# Patient Record
Sex: Female | Born: 1945 | Race: White | Hispanic: No | Marital: Married | Smoking: Never smoker
Health system: Southern US, Community
[De-identification: ages and names within clinical notes are randomized; demographics above are authoritative.]

## PROBLEM LIST (undated history)

## (undated) DIAGNOSIS — A389 Scarlet fever, uncomplicated: Secondary | ICD-10-CM

## (undated) HISTORY — PX: HAND SURGERY: SHX662

---
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
## 1976-07-28 HISTORY — PX: DIAGNOSTIC LAPAROSCOPY: SUR761

## 1997-10-03 ENCOUNTER — Ambulatory Visit (HOSPITAL_COMMUNITY): Admission: RE | Admit: 1997-10-03 | Discharge: 1997-10-03 | Payer: Self-pay | Admitting: Obstetrics & Gynecology

## 1997-10-05 ENCOUNTER — Ambulatory Visit (HOSPITAL_COMMUNITY): Admission: RE | Admit: 1997-10-05 | Discharge: 1997-10-05 | Payer: Self-pay | Admitting: Obstetrics & Gynecology

## 1997-10-12 ENCOUNTER — Other Ambulatory Visit: Admission: RE | Admit: 1997-10-12 | Discharge: 1997-10-12 | Payer: Self-pay | Admitting: Obstetrics & Gynecology

## 1998-06-14 ENCOUNTER — Ambulatory Visit (HOSPITAL_COMMUNITY): Admission: RE | Admit: 1998-06-14 | Discharge: 1998-06-14 | Payer: Self-pay | Admitting: Obstetrics & Gynecology

## 1998-06-14 ENCOUNTER — Encounter: Payer: Self-pay | Admitting: Obstetrics & Gynecology

## 1998-10-18 ENCOUNTER — Other Ambulatory Visit: Admission: RE | Admit: 1998-10-18 | Discharge: 1998-10-18 | Payer: Self-pay | Admitting: Obstetrics & Gynecology

## 1999-03-25 ENCOUNTER — Ambulatory Visit (HOSPITAL_COMMUNITY): Admission: RE | Admit: 1999-03-25 | Discharge: 1999-03-25 | Payer: Self-pay | Admitting: Obstetrics & Gynecology

## 1999-03-25 ENCOUNTER — Encounter: Payer: Self-pay | Admitting: Obstetrics & Gynecology

## 2000-06-11 ENCOUNTER — Encounter: Payer: Self-pay | Admitting: Obstetrics & Gynecology

## 2000-06-11 ENCOUNTER — Ambulatory Visit (HOSPITAL_COMMUNITY): Admission: RE | Admit: 2000-06-11 | Discharge: 2000-06-11 | Payer: Self-pay | Admitting: Obstetrics & Gynecology

## 2000-08-10 ENCOUNTER — Other Ambulatory Visit: Admission: RE | Admit: 2000-08-10 | Discharge: 2000-08-10 | Payer: Self-pay | Admitting: Obstetrics & Gynecology

## 2001-02-02 ENCOUNTER — Encounter (INDEPENDENT_AMBULATORY_CARE_PROVIDER_SITE_OTHER): Payer: Self-pay | Admitting: Specialist

## 2001-02-02 ENCOUNTER — Inpatient Hospital Stay (HOSPITAL_COMMUNITY): Admission: RE | Admit: 2001-02-02 | Discharge: 2001-02-05 | Payer: Self-pay | Admitting: Obstetrics & Gynecology

## 2001-08-03 ENCOUNTER — Ambulatory Visit (HOSPITAL_COMMUNITY): Admission: RE | Admit: 2001-08-03 | Discharge: 2001-08-03 | Payer: Self-pay | Admitting: Obstetrics & Gynecology

## 2001-08-03 ENCOUNTER — Encounter: Payer: Self-pay | Admitting: Obstetrics & Gynecology

## 2002-11-07 ENCOUNTER — Encounter: Payer: Self-pay | Admitting: Obstetrics & Gynecology

## 2002-11-07 ENCOUNTER — Ambulatory Visit (HOSPITAL_COMMUNITY): Admission: RE | Admit: 2002-11-07 | Discharge: 2002-11-07 | Payer: Self-pay | Admitting: Obstetrics & Gynecology

## 2002-11-09 ENCOUNTER — Encounter: Payer: Self-pay | Admitting: Obstetrics & Gynecology

## 2002-11-09 ENCOUNTER — Encounter: Admission: RE | Admit: 2002-11-09 | Discharge: 2002-11-09 | Payer: Self-pay | Admitting: Obstetrics & Gynecology

## 2002-11-16 ENCOUNTER — Other Ambulatory Visit: Admission: RE | Admit: 2002-11-16 | Discharge: 2002-11-16 | Payer: Self-pay | Admitting: Obstetrics & Gynecology

## 2004-01-25 ENCOUNTER — Other Ambulatory Visit: Admission: RE | Admit: 2004-01-25 | Discharge: 2004-01-25 | Payer: Self-pay | Admitting: Obstetrics & Gynecology

## 2004-06-24 ENCOUNTER — Ambulatory Visit: Payer: Self-pay | Admitting: Internal Medicine

## 2004-07-22 ENCOUNTER — Ambulatory Visit (HOSPITAL_COMMUNITY): Admission: RE | Admit: 2004-07-22 | Discharge: 2004-07-22 | Payer: Self-pay | Admitting: Internal Medicine

## 2004-07-23 ENCOUNTER — Ambulatory Visit: Payer: Self-pay | Admitting: Internal Medicine

## 2004-08-27 ENCOUNTER — Ambulatory Visit: Payer: Self-pay | Admitting: Internal Medicine

## 2004-10-11 ENCOUNTER — Ambulatory Visit: Payer: Self-pay | Admitting: Internal Medicine

## 2005-06-12 ENCOUNTER — Other Ambulatory Visit: Admission: RE | Admit: 2005-06-12 | Discharge: 2005-06-12 | Payer: Self-pay | Admitting: Obstetrics & Gynecology

## 2008-08-29 ENCOUNTER — Ambulatory Visit: Payer: Self-pay | Admitting: Internal Medicine

## 2008-09-12 ENCOUNTER — Ambulatory Visit: Payer: Self-pay | Admitting: Internal Medicine

## 2010-12-13 NOTE — H&P (Signed)
[facility]  Patient:    Annette Wilson, Annette Wilson                  MRN: [id number] Adm. Date:  02/02/01 Attending:  Freddy Finner, M.D.                         History and Physical  SOCIAL SECURITY NUMBER:       [id number]  DATE OF BIRTH:                Jul 07, 1946  ADMITTING DIAGNOSES:          1. Cystocele with prolapse.                               2. Uterine prolapse.                               3. First degree rectocele.                               4. Symptomatic pelvic relaxation.  HISTORY:                      The patient is a 65 year old married white female, gravida 3, para 2 who has a long history of cystocele.  Over the past couple of years, she has had progressive increase in symptoms and prolapse, and now notices complete prolapse of the cystocele with Valsalva.  On physical examination in the office, this was confirmed.  She does not have stress incontinence at this point, but does have a lot of pressure, which precludes normal physical activity.  Also on exam, she was found to have uterine prolapse and a first degree rectocele.  The uterus itself was slightly enlarged to approximately eight-week size.  There were no palpable adnexal masses.  The patient is now admitted for definitive surgery.  Specifically total vaginal hysterectomy and bilateral salpingo-oophorectomy, anterior and posterior vaginal repair and possible sacrospinous ligament suspension.  REVIEW OF SYSTEMS:            Otherwise negative.  No cardiopulmonary, GI or GU complaints.  PAST MEDICAL HISTORY:         No significant medical illnesses.  Recent normal mammogram and Pap smear within the last eight months.  CURRENT MEDICATIONS:          None.  ALLERGIES TO MEDICATIONS:     Aspirin.  Duricef.  Cephalosporin.  BLOOD TRANSFUSIONS:           None.  CIGARETTES:                   None.  PAST SURGICAL HISTORY:        Vaginal delivery x 2.   Laparoscopy.  FAMILY HISTORY:               Noncontributory.  PHYSICAL EXAMINATION:  HEENT:                        Grossly within normal limits.  VITAL SIGNS:                  Blood pressure in the office 120/70, weight 140 lb.  NECK:  Thyroid gland is not palpably enlarged.  BREASTS:                      Normal.  No skin change.  No nipple discharge. No palpable masses.  HEART:                        Normal sinus rhythm without murmurs, rubs, or gallops.  ABDOMEN:                      Soft and nontender without appreciable organomegaly or palpable masses.  PELVIC:                       Findings are noted above.  EXTREMITIES:                  Without clubbing, cyanosis, or edema.  ASSESSMENT:                   Symptomatic pelvic relaxation with third degree cystocele, uterine prolapse and first degree rectocele.  PLAN:                         Surgery as outline above.  The patient has been counseled extensively regarding her surgery including a video which she reviewed in the office describing the operative procedure.  She is aware of the potential risks of the procedure and the small potential risk of long-term failure of the procedure. DD:  02/01/01 TD:  02/02/01 Job: [id number]

## 2010-12-13 NOTE — Discharge Summary (Signed)
[facility]  Patient:    Annette Wilson, Annette Wilson                  MRN: [id number] Adm. Date:  [id number] Disc. Date: [id number] Attending:  Minette Headland                           Discharge Summary  DISCHARGE DIAGNOSES:          1. Uterine prolapse.                               2. Cystocele with prolapse.                               3. Rectocele.                               4. 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  OPERATIVE PROCEDURE:          1. Total vaginal hysterectomy.                               2. Bilateral salpingo-oophorectomy.                               3. Anterior and posterior vaginal repair.                               4. Sacrospinous ligament suspension.  POSTOPERATIVE COMPLICATIONS:  None.  DISPOSITION:                  The patient was in satisfactory and improved condition at the time of her discharge on the third postoperative day. She was ambulating without difficulty. She was having adequate bowel and bladder function. Her condition was considered to be good.  DISCHARGE MEDICATIONS:        She was discharged home with Percocet for pain. In addition to her pain medication, she was given Urispas 100 mg to be taken t.i.d. as needed.  DISCHARGE INSTRUCTIONS:       Regular diet. She is to report fever, heavy vaginal bleeding. She is to avoid heavy lifting or vaginal entry.  DISCHARGE FOLLOWUP:           The patient is to follow up in the office in approximately two weeks.  Details of the present illness, past history, family history, review of systems, and physical exam recorded in the admission note. Physical findings on admission were essentially normal except for the pelvic findings as noted in the discharge diagnosis.  LABORATORY DATA:              Initial hemoglobin 14.6,  postoperative hemoglobin of 10.4 on the first postoperative  day and 10 on the second postoperative day. Prothrombin time, PTT, and INR were all normal on admission.  HOSPITAL COURSE:              The patient was admitted on the morning of surgery, July 9. She was treated perioperatively with PAS hose. She was given intravenous antibiotic preoperatively. The above described procedure was accomplished without significant intraoperative difficulty. Her postoperative course was uncomplicated. Her condition was good on the third postoperative day and she was discharged home with disposition as noted above. DD:  03/03/01 TD:  03/03/01 Job: [id number]

## 2010-12-13 NOTE — Op Note (Signed)
[facility]  Patient:    Annette Wilson, Annette Wilson                  MRN: [id number] Proc. Date: 02/02/01 Adm. Date:  [id number] Attending:  Minette Headland                           Operative Report  PREOPERATIVE DIAGNOSES:       Cystocele with prolapse, uterine prolapse, first degree rectocele.  POSTOPERATIVE DIAGNOSES:  OPERATIVE PROCEDURE:          Total vaginal hysterectomy, bilateral salpingo-oophorectomy, anterior and posterior colporrhaphy, sacrospinous ligament suspension, suprapubic cystotomy with Bonnano catheter.  SURGEON:                      Freddy Finner, M.D.  ASSISTANTWilley Blade, M.D.  ANESTHESIA:  ESTIMATED INTRAOPERATIVE BLOOD LOSS:  350 cc.  ANESTHESIA:                   General endotracheal.  INTRAOPERATIVE COMPLICATIONS:  None.  INDICATIONS:                  Details of the present illness are recorded in the admission note.  Patient was admitted on the morning of surgery.  Due to antibiotic allergies, it was elected to give her Zithromax 500 mg IV preoperatively.  DESCRIPTION OF PROCEDURE:     She was taken to the operating room and there placed under adequate general endotracheal anesthesia, placed in PAS hose and placed in the dorsal lithotomy position.  Betadine prep using scrub followed by solution was carried out of lower abdomen, mons, perineum and vagina. Sterile drapes were applied.  A posterior weighted vaginal retractor was placed.  Cervix was grasped with a Christella Hartigan tenaculum.  Colpotomy incision was made by tenting the mucosa posterior to the cervix and entering sharply with Mayo scissors.  Cervix was circumscribed with a scalpel to release the mucosa from the cervix.  Using the Ligasure system, uterosacral pedicles, bladder pillars, cardinal ligament pedicles and vessel pedicles were developed.  The anterior peritoneum was entered before developing the vessel pedicles.  An additional  pedicle was taken on each side just above the vessels to ensure hemostasis.  Uterus was delivered through the introitus.  Curved Heaneys were placed on the uteroovarian pedicles on each side.  Tubes and ovaries were then grasped with Babcock clamps and the infundibulopelvic ligament controlled with the Ligasure system and the tube and ovary on each side removed.  Hemostasis was noted to be complete.  Angles of the vagina were then anchored to uterosacrals with an interrupted mattress suture of 0 Monocryl.  Two plication sutures were placed in the uterosacrals, one approximately 2 cm from the cuff and one approximately 1 cm from the cuff; this was also used to close the posterior peritoneum and reduce the posterior space.  Cuff was closed vertically with figure-of-eights for its posterior half.  Edges of the anterior mucosa were then grasped with Allises.  Mucosa was tented in the midline.  With progressive sharp and blunt dissection, the bladder was freed from the mucosa and vesicovaginal fascia and an incision made in the midline. There was excellent support of the UV angle and this was not taken down.  With careful sharp dissection, the bladder was carefully freed.  Plication sutures of 0 Monocryl were then placed using a total of three sutures to reduce the cystocele.  Segments of mucosa were excised anteriorly.  The cuff was completely closed with figure-of-eights of 0 Monocryl.  The remaining mucosal incision was closed with a running 0 Monocryl.  The fourchette was then grasped on each side with an Allis clamp and a pyramidal-shaped segment of skin removed from the perineal body.  The mucosa overlying the rectum was then carefully dissected free and an incision made in the midline.  Using a 0 Monocryl suture and Shutt clamp, the suture was placed through the sacrospinous ligament on the right.  Later during the procedure, the stitch broke when trying to tie it and for that reason, this  suture had to be replaced.  The Shutt clamp was not operating properly and it was used to grasp the sacrospinous ligament and a 0 Vicryl on a UR-6 needle was passed through the ligament at the point of the Shutt clamp and anchored to the vaginal mucosa.  Plication sutures posteriorly were of 0 Monocryl, re-creating the rectovaginal septum with a total of three sutures and reapproximating the levators and the perineal body with an additional two sutures of 0 Monocryl. The mucosal closure was started posteriorly and the sacrospinous stitch then tied, which adequately elevated the vagina.  The mucosal incision and skin incision were closed with a continuous running 2-0 Monocryl in a fashion similar to episiotomy.  Bladder was then filled with 300 cc of irrigating solution and a Bonnano catheter placed without difficulty and anchored to the skin suprapubically.  A two-inch Iodoform gauze pack was placed.  The patient tolerated the operative procedure well, was awakened and taken to recovery room in good condition. DD:  02/02/01 TD:  02/02/01 Job: [id number]

## 2011-08-25 DIAGNOSIS — Z0000 Encounter for general adult medical examination without abnormal findings: Secondary | ICD-10-CM | POA: Diagnosis not present

## 2011-08-25 DIAGNOSIS — R7309 Other abnormal glucose: Secondary | ICD-10-CM | POA: Diagnosis not present

## 2011-09-02 DIAGNOSIS — Z136 Encounter for screening for cardiovascular disorders: Secondary | ICD-10-CM | POA: Diagnosis not present

## 2011-09-02 DIAGNOSIS — R7309 Other abnormal glucose: Secondary | ICD-10-CM | POA: Diagnosis not present

## 2011-09-15 DIAGNOSIS — Z1231 Encounter for screening mammogram for malignant neoplasm of breast: Secondary | ICD-10-CM | POA: Diagnosis not present

## 2011-09-15 DIAGNOSIS — Z1212 Encounter for screening for malignant neoplasm of rectum: Secondary | ICD-10-CM | POA: Diagnosis not present

## 2011-09-15 DIAGNOSIS — Z124 Encounter for screening for malignant neoplasm of cervix: Secondary | ICD-10-CM | POA: Diagnosis not present

## 2011-09-15 DIAGNOSIS — B373 Candidiasis of vulva and vagina: Secondary | ICD-10-CM | POA: Diagnosis not present

## 2011-09-15 DIAGNOSIS — N390 Urinary tract infection, site not specified: Secondary | ICD-10-CM | POA: Diagnosis not present

## 2011-11-03 DIAGNOSIS — N952 Postmenopausal atrophic vaginitis: Secondary | ICD-10-CM | POA: Diagnosis not present

## 2011-11-03 DIAGNOSIS — N390 Urinary tract infection, site not specified: Secondary | ICD-10-CM | POA: Diagnosis not present

## 2011-11-19 DIAGNOSIS — M9981 Other biomechanical lesions of cervical region: Secondary | ICD-10-CM | POA: Diagnosis not present

## 2011-11-19 DIAGNOSIS — M5137 Other intervertebral disc degeneration, lumbosacral region: Secondary | ICD-10-CM | POA: Diagnosis not present

## 2011-11-19 DIAGNOSIS — M999 Biomechanical lesion, unspecified: Secondary | ICD-10-CM | POA: Diagnosis not present

## 2011-11-21 DIAGNOSIS — M5137 Other intervertebral disc degeneration, lumbosacral region: Secondary | ICD-10-CM | POA: Diagnosis not present

## 2011-11-21 DIAGNOSIS — M9981 Other biomechanical lesions of cervical region: Secondary | ICD-10-CM | POA: Diagnosis not present

## 2011-11-21 DIAGNOSIS — M999 Biomechanical lesion, unspecified: Secondary | ICD-10-CM | POA: Diagnosis not present

## 2011-11-24 DIAGNOSIS — M999 Biomechanical lesion, unspecified: Secondary | ICD-10-CM | POA: Diagnosis not present

## 2011-11-24 DIAGNOSIS — M5137 Other intervertebral disc degeneration, lumbosacral region: Secondary | ICD-10-CM | POA: Diagnosis not present

## 2011-11-24 DIAGNOSIS — M9981 Other biomechanical lesions of cervical region: Secondary | ICD-10-CM | POA: Diagnosis not present

## 2011-11-26 DIAGNOSIS — M5137 Other intervertebral disc degeneration, lumbosacral region: Secondary | ICD-10-CM | POA: Diagnosis not present

## 2011-11-26 DIAGNOSIS — M999 Biomechanical lesion, unspecified: Secondary | ICD-10-CM | POA: Diagnosis not present

## 2011-11-26 DIAGNOSIS — M9981 Other biomechanical lesions of cervical region: Secondary | ICD-10-CM | POA: Diagnosis not present

## 2011-11-28 DIAGNOSIS — M5137 Other intervertebral disc degeneration, lumbosacral region: Secondary | ICD-10-CM | POA: Diagnosis not present

## 2011-11-28 DIAGNOSIS — M999 Biomechanical lesion, unspecified: Secondary | ICD-10-CM | POA: Diagnosis not present

## 2011-11-28 DIAGNOSIS — M9981 Other biomechanical lesions of cervical region: Secondary | ICD-10-CM | POA: Diagnosis not present

## 2011-12-01 DIAGNOSIS — M9981 Other biomechanical lesions of cervical region: Secondary | ICD-10-CM | POA: Diagnosis not present

## 2011-12-01 DIAGNOSIS — M999 Biomechanical lesion, unspecified: Secondary | ICD-10-CM | POA: Diagnosis not present

## 2011-12-01 DIAGNOSIS — M5137 Other intervertebral disc degeneration, lumbosacral region: Secondary | ICD-10-CM | POA: Diagnosis not present

## 2011-12-05 DIAGNOSIS — M999 Biomechanical lesion, unspecified: Secondary | ICD-10-CM | POA: Diagnosis not present

## 2011-12-05 DIAGNOSIS — M5137 Other intervertebral disc degeneration, lumbosacral region: Secondary | ICD-10-CM | POA: Diagnosis not present

## 2011-12-05 DIAGNOSIS — M9981 Other biomechanical lesions of cervical region: Secondary | ICD-10-CM | POA: Diagnosis not present

## 2011-12-09 DIAGNOSIS — M999 Biomechanical lesion, unspecified: Secondary | ICD-10-CM | POA: Diagnosis not present

## 2011-12-09 DIAGNOSIS — M5137 Other intervertebral disc degeneration, lumbosacral region: Secondary | ICD-10-CM | POA: Diagnosis not present

## 2011-12-09 DIAGNOSIS — M9981 Other biomechanical lesions of cervical region: Secondary | ICD-10-CM | POA: Diagnosis not present

## 2011-12-23 DIAGNOSIS — M5137 Other intervertebral disc degeneration, lumbosacral region: Secondary | ICD-10-CM | POA: Diagnosis not present

## 2011-12-23 DIAGNOSIS — M999 Biomechanical lesion, unspecified: Secondary | ICD-10-CM | POA: Diagnosis not present

## 2011-12-23 DIAGNOSIS — M9981 Other biomechanical lesions of cervical region: Secondary | ICD-10-CM | POA: Diagnosis not present

## 2011-12-29 DIAGNOSIS — M9981 Other biomechanical lesions of cervical region: Secondary | ICD-10-CM | POA: Diagnosis not present

## 2011-12-29 DIAGNOSIS — M999 Biomechanical lesion, unspecified: Secondary | ICD-10-CM | POA: Diagnosis not present

## 2011-12-29 DIAGNOSIS — M5137 Other intervertebral disc degeneration, lumbosacral region: Secondary | ICD-10-CM | POA: Diagnosis not present

## 2012-01-05 DIAGNOSIS — M999 Biomechanical lesion, unspecified: Secondary | ICD-10-CM | POA: Diagnosis not present

## 2012-01-05 DIAGNOSIS — M5137 Other intervertebral disc degeneration, lumbosacral region: Secondary | ICD-10-CM | POA: Diagnosis not present

## 2012-01-05 DIAGNOSIS — M9981 Other biomechanical lesions of cervical region: Secondary | ICD-10-CM | POA: Diagnosis not present

## 2012-01-07 DIAGNOSIS — M9981 Other biomechanical lesions of cervical region: Secondary | ICD-10-CM | POA: Diagnosis not present

## 2012-01-07 DIAGNOSIS — M999 Biomechanical lesion, unspecified: Secondary | ICD-10-CM | POA: Diagnosis not present

## 2012-01-07 DIAGNOSIS — M5137 Other intervertebral disc degeneration, lumbosacral region: Secondary | ICD-10-CM | POA: Diagnosis not present

## 2012-01-12 DIAGNOSIS — M999 Biomechanical lesion, unspecified: Secondary | ICD-10-CM | POA: Diagnosis not present

## 2012-01-12 DIAGNOSIS — M9981 Other biomechanical lesions of cervical region: Secondary | ICD-10-CM | POA: Diagnosis not present

## 2012-01-12 DIAGNOSIS — M5137 Other intervertebral disc degeneration, lumbosacral region: Secondary | ICD-10-CM | POA: Diagnosis not present

## 2012-01-16 DIAGNOSIS — M9981 Other biomechanical lesions of cervical region: Secondary | ICD-10-CM | POA: Diagnosis not present

## 2012-01-16 DIAGNOSIS — M999 Biomechanical lesion, unspecified: Secondary | ICD-10-CM | POA: Diagnosis not present

## 2012-01-16 DIAGNOSIS — M5137 Other intervertebral disc degeneration, lumbosacral region: Secondary | ICD-10-CM | POA: Diagnosis not present

## 2012-01-23 DIAGNOSIS — M5137 Other intervertebral disc degeneration, lumbosacral region: Secondary | ICD-10-CM | POA: Diagnosis not present

## 2012-01-23 DIAGNOSIS — M999 Biomechanical lesion, unspecified: Secondary | ICD-10-CM | POA: Diagnosis not present

## 2012-01-23 DIAGNOSIS — M9981 Other biomechanical lesions of cervical region: Secondary | ICD-10-CM | POA: Diagnosis not present

## 2012-01-26 DIAGNOSIS — M5137 Other intervertebral disc degeneration, lumbosacral region: Secondary | ICD-10-CM | POA: Diagnosis not present

## 2012-01-26 DIAGNOSIS — M999 Biomechanical lesion, unspecified: Secondary | ICD-10-CM | POA: Diagnosis not present

## 2012-01-26 DIAGNOSIS — M9981 Other biomechanical lesions of cervical region: Secondary | ICD-10-CM | POA: Diagnosis not present

## 2012-01-27 DIAGNOSIS — M5137 Other intervertebral disc degeneration, lumbosacral region: Secondary | ICD-10-CM | POA: Diagnosis not present

## 2012-01-27 DIAGNOSIS — M9981 Other biomechanical lesions of cervical region: Secondary | ICD-10-CM | POA: Diagnosis not present

## 2012-01-27 DIAGNOSIS — M999 Biomechanical lesion, unspecified: Secondary | ICD-10-CM | POA: Diagnosis not present

## 2012-01-30 DIAGNOSIS — M999 Biomechanical lesion, unspecified: Secondary | ICD-10-CM | POA: Diagnosis not present

## 2012-01-30 DIAGNOSIS — M9981 Other biomechanical lesions of cervical region: Secondary | ICD-10-CM | POA: Diagnosis not present

## 2012-01-30 DIAGNOSIS — M5137 Other intervertebral disc degeneration, lumbosacral region: Secondary | ICD-10-CM | POA: Diagnosis not present

## 2012-02-04 DIAGNOSIS — M5137 Other intervertebral disc degeneration, lumbosacral region: Secondary | ICD-10-CM | POA: Diagnosis not present

## 2012-02-04 DIAGNOSIS — M9981 Other biomechanical lesions of cervical region: Secondary | ICD-10-CM | POA: Diagnosis not present

## 2012-02-04 DIAGNOSIS — M999 Biomechanical lesion, unspecified: Secondary | ICD-10-CM | POA: Diagnosis not present

## 2012-02-13 DIAGNOSIS — M999 Biomechanical lesion, unspecified: Secondary | ICD-10-CM | POA: Diagnosis not present

## 2012-02-13 DIAGNOSIS — M9981 Other biomechanical lesions of cervical region: Secondary | ICD-10-CM | POA: Diagnosis not present

## 2012-02-13 DIAGNOSIS — M5137 Other intervertebral disc degeneration, lumbosacral region: Secondary | ICD-10-CM | POA: Diagnosis not present

## 2012-02-27 DIAGNOSIS — M999 Biomechanical lesion, unspecified: Secondary | ICD-10-CM | POA: Diagnosis not present

## 2012-02-27 DIAGNOSIS — M9981 Other biomechanical lesions of cervical region: Secondary | ICD-10-CM | POA: Diagnosis not present

## 2012-02-27 DIAGNOSIS — M5137 Other intervertebral disc degeneration, lumbosacral region: Secondary | ICD-10-CM | POA: Diagnosis not present

## 2012-03-01 DIAGNOSIS — M9981 Other biomechanical lesions of cervical region: Secondary | ICD-10-CM | POA: Diagnosis not present

## 2012-03-01 DIAGNOSIS — M999 Biomechanical lesion, unspecified: Secondary | ICD-10-CM | POA: Diagnosis not present

## 2012-03-01 DIAGNOSIS — M5137 Other intervertebral disc degeneration, lumbosacral region: Secondary | ICD-10-CM | POA: Diagnosis not present

## 2012-03-12 DIAGNOSIS — M439 Deforming dorsopathy, unspecified: Secondary | ICD-10-CM | POA: Diagnosis not present

## 2012-03-12 DIAGNOSIS — M999 Biomechanical lesion, unspecified: Secondary | ICD-10-CM | POA: Diagnosis not present

## 2012-03-12 DIAGNOSIS — M9981 Other biomechanical lesions of cervical region: Secondary | ICD-10-CM | POA: Diagnosis not present

## 2012-03-12 DIAGNOSIS — M5137 Other intervertebral disc degeneration, lumbosacral region: Secondary | ICD-10-CM | POA: Diagnosis not present

## 2012-03-18 DIAGNOSIS — N952 Postmenopausal atrophic vaginitis: Secondary | ICD-10-CM | POA: Diagnosis not present

## 2012-03-18 DIAGNOSIS — N8111 Cystocele, midline: Secondary | ICD-10-CM | POA: Diagnosis not present

## 2012-03-22 DIAGNOSIS — M9981 Other biomechanical lesions of cervical region: Secondary | ICD-10-CM | POA: Diagnosis not present

## 2012-03-22 DIAGNOSIS — M999 Biomechanical lesion, unspecified: Secondary | ICD-10-CM | POA: Diagnosis not present

## 2012-03-22 DIAGNOSIS — M5137 Other intervertebral disc degeneration, lumbosacral region: Secondary | ICD-10-CM | POA: Diagnosis not present

## 2012-04-09 DIAGNOSIS — M9981 Other biomechanical lesions of cervical region: Secondary | ICD-10-CM | POA: Diagnosis not present

## 2012-04-09 DIAGNOSIS — M999 Biomechanical lesion, unspecified: Secondary | ICD-10-CM | POA: Diagnosis not present

## 2012-04-09 DIAGNOSIS — M5137 Other intervertebral disc degeneration, lumbosacral region: Secondary | ICD-10-CM | POA: Diagnosis not present

## 2012-04-30 DIAGNOSIS — M9981 Other biomechanical lesions of cervical region: Secondary | ICD-10-CM | POA: Diagnosis not present

## 2012-04-30 DIAGNOSIS — M999 Biomechanical lesion, unspecified: Secondary | ICD-10-CM | POA: Diagnosis not present

## 2012-04-30 DIAGNOSIS — M5137 Other intervertebral disc degeneration, lumbosacral region: Secondary | ICD-10-CM | POA: Diagnosis not present

## 2012-05-21 DIAGNOSIS — M5137 Other intervertebral disc degeneration, lumbosacral region: Secondary | ICD-10-CM | POA: Diagnosis not present

## 2012-05-21 DIAGNOSIS — M9981 Other biomechanical lesions of cervical region: Secondary | ICD-10-CM | POA: Diagnosis not present

## 2012-05-21 DIAGNOSIS — M999 Biomechanical lesion, unspecified: Secondary | ICD-10-CM | POA: Diagnosis not present

## 2012-06-11 DIAGNOSIS — M999 Biomechanical lesion, unspecified: Secondary | ICD-10-CM | POA: Diagnosis not present

## 2012-06-11 DIAGNOSIS — M9981 Other biomechanical lesions of cervical region: Secondary | ICD-10-CM | POA: Diagnosis not present

## 2012-06-11 DIAGNOSIS — M5137 Other intervertebral disc degeneration, lumbosacral region: Secondary | ICD-10-CM | POA: Diagnosis not present

## 2012-06-14 DIAGNOSIS — M5137 Other intervertebral disc degeneration, lumbosacral region: Secondary | ICD-10-CM | POA: Diagnosis not present

## 2012-06-14 DIAGNOSIS — M9981 Other biomechanical lesions of cervical region: Secondary | ICD-10-CM | POA: Diagnosis not present

## 2012-06-14 DIAGNOSIS — M999 Biomechanical lesion, unspecified: Secondary | ICD-10-CM | POA: Diagnosis not present

## 2012-06-16 DIAGNOSIS — M999 Biomechanical lesion, unspecified: Secondary | ICD-10-CM | POA: Diagnosis not present

## 2012-06-16 DIAGNOSIS — M9981 Other biomechanical lesions of cervical region: Secondary | ICD-10-CM | POA: Diagnosis not present

## 2012-06-16 DIAGNOSIS — M5137 Other intervertebral disc degeneration, lumbosacral region: Secondary | ICD-10-CM | POA: Diagnosis not present

## 2012-06-18 DIAGNOSIS — M5137 Other intervertebral disc degeneration, lumbosacral region: Secondary | ICD-10-CM | POA: Diagnosis not present

## 2012-06-18 DIAGNOSIS — M9981 Other biomechanical lesions of cervical region: Secondary | ICD-10-CM | POA: Diagnosis not present

## 2012-06-18 DIAGNOSIS — M999 Biomechanical lesion, unspecified: Secondary | ICD-10-CM | POA: Diagnosis not present

## 2012-06-21 DIAGNOSIS — M5137 Other intervertebral disc degeneration, lumbosacral region: Secondary | ICD-10-CM | POA: Diagnosis not present

## 2012-06-21 DIAGNOSIS — M9981 Other biomechanical lesions of cervical region: Secondary | ICD-10-CM | POA: Diagnosis not present

## 2012-06-21 DIAGNOSIS — M999 Biomechanical lesion, unspecified: Secondary | ICD-10-CM | POA: Diagnosis not present

## 2012-06-23 DIAGNOSIS — M999 Biomechanical lesion, unspecified: Secondary | ICD-10-CM | POA: Diagnosis not present

## 2012-06-23 DIAGNOSIS — M9981 Other biomechanical lesions of cervical region: Secondary | ICD-10-CM | POA: Diagnosis not present

## 2012-06-23 DIAGNOSIS — M5137 Other intervertebral disc degeneration, lumbosacral region: Secondary | ICD-10-CM | POA: Diagnosis not present

## 2012-07-02 DIAGNOSIS — M9981 Other biomechanical lesions of cervical region: Secondary | ICD-10-CM | POA: Diagnosis not present

## 2012-07-02 DIAGNOSIS — M5137 Other intervertebral disc degeneration, lumbosacral region: Secondary | ICD-10-CM | POA: Diagnosis not present

## 2012-07-02 DIAGNOSIS — M999 Biomechanical lesion, unspecified: Secondary | ICD-10-CM | POA: Diagnosis not present

## 2012-07-09 DIAGNOSIS — M9981 Other biomechanical lesions of cervical region: Secondary | ICD-10-CM | POA: Diagnosis not present

## 2012-07-09 DIAGNOSIS — M5137 Other intervertebral disc degeneration, lumbosacral region: Secondary | ICD-10-CM | POA: Diagnosis not present

## 2012-07-09 DIAGNOSIS — M999 Biomechanical lesion, unspecified: Secondary | ICD-10-CM | POA: Diagnosis not present

## 2012-07-12 DIAGNOSIS — M9981 Other biomechanical lesions of cervical region: Secondary | ICD-10-CM | POA: Diagnosis not present

## 2012-07-12 DIAGNOSIS — M999 Biomechanical lesion, unspecified: Secondary | ICD-10-CM | POA: Diagnosis not present

## 2012-07-12 DIAGNOSIS — M5137 Other intervertebral disc degeneration, lumbosacral region: Secondary | ICD-10-CM | POA: Diagnosis not present

## 2013-03-09 DIAGNOSIS — Z124 Encounter for screening for malignant neoplasm of cervix: Secondary | ICD-10-CM | POA: Diagnosis not present

## 2013-03-09 DIAGNOSIS — Z130 Encounter for screening for diseases of the blood and blood-forming organs and certain disorders involving the immune mechanism: Secondary | ICD-10-CM | POA: Diagnosis not present

## 2013-03-09 DIAGNOSIS — Z1212 Encounter for screening for malignant neoplasm of rectum: Secondary | ICD-10-CM | POA: Diagnosis not present

## 2013-03-09 DIAGNOSIS — Z13820 Encounter for screening for osteoporosis: Secondary | ICD-10-CM | POA: Diagnosis not present

## 2013-03-09 DIAGNOSIS — Z0000 Encounter for general adult medical examination without abnormal findings: Secondary | ICD-10-CM | POA: Diagnosis not present

## 2013-03-09 DIAGNOSIS — M899 Disorder of bone, unspecified: Secondary | ICD-10-CM | POA: Diagnosis not present

## 2013-03-09 DIAGNOSIS — M949 Disorder of cartilage, unspecified: Secondary | ICD-10-CM | POA: Diagnosis not present

## 2013-03-09 DIAGNOSIS — Z1231 Encounter for screening mammogram for malignant neoplasm of breast: Secondary | ICD-10-CM | POA: Diagnosis not present

## 2013-07-04 DIAGNOSIS — R109 Unspecified abdominal pain: Secondary | ICD-10-CM | POA: Diagnosis not present

## 2013-10-18 DIAGNOSIS — R109 Unspecified abdominal pain: Secondary | ICD-10-CM | POA: Diagnosis not present

## 2013-10-18 DIAGNOSIS — M79609 Pain in unspecified limb: Secondary | ICD-10-CM | POA: Diagnosis not present

## 2013-10-18 DIAGNOSIS — Z23 Encounter for immunization: Secondary | ICD-10-CM | POA: Diagnosis not present

## 2013-10-18 DIAGNOSIS — Z0000 Encounter for general adult medical examination without abnormal findings: Secondary | ICD-10-CM | POA: Diagnosis not present

## 2013-10-18 DIAGNOSIS — R7301 Impaired fasting glucose: Secondary | ICD-10-CM | POA: Diagnosis not present

## 2013-10-18 DIAGNOSIS — Z1331 Encounter for screening for depression: Secondary | ICD-10-CM | POA: Diagnosis not present

## 2013-10-25 DIAGNOSIS — R7301 Impaired fasting glucose: Secondary | ICD-10-CM | POA: Diagnosis not present

## 2013-11-03 DIAGNOSIS — M7750 Other enthesopathy of unspecified foot: Secondary | ICD-10-CM | POA: Diagnosis not present

## 2013-11-03 DIAGNOSIS — M2040 Other hammer toe(s) (acquired), unspecified foot: Secondary | ICD-10-CM | POA: Diagnosis not present

## 2014-01-30 DIAGNOSIS — M2040 Other hammer toe(s) (acquired), unspecified foot: Secondary | ICD-10-CM | POA: Diagnosis not present

## 2014-01-30 DIAGNOSIS — M7750 Other enthesopathy of unspecified foot: Secondary | ICD-10-CM | POA: Diagnosis not present

## 2014-04-12 DIAGNOSIS — Z01419 Encounter for gynecological examination (general) (routine) without abnormal findings: Secondary | ICD-10-CM | POA: Diagnosis not present

## 2014-04-12 DIAGNOSIS — Z1231 Encounter for screening mammogram for malignant neoplasm of breast: Secondary | ICD-10-CM | POA: Diagnosis not present

## 2014-10-24 DIAGNOSIS — Z0000 Encounter for general adult medical examination without abnormal findings: Secondary | ICD-10-CM | POA: Diagnosis not present

## 2014-10-24 DIAGNOSIS — R7309 Other abnormal glucose: Secondary | ICD-10-CM | POA: Diagnosis not present

## 2014-10-24 DIAGNOSIS — M25561 Pain in right knee: Secondary | ICD-10-CM | POA: Diagnosis not present

## 2014-10-24 DIAGNOSIS — Z1389 Encounter for screening for other disorder: Secondary | ICD-10-CM | POA: Diagnosis not present

## 2014-10-31 DIAGNOSIS — R7309 Other abnormal glucose: Secondary | ICD-10-CM | POA: Diagnosis not present

## 2015-01-17 ENCOUNTER — Encounter: Payer: Self-pay | Admitting: Internal Medicine

## 2015-04-17 DIAGNOSIS — Z6825 Body mass index (BMI) 25.0-25.9, adult: Secondary | ICD-10-CM | POA: Diagnosis not present

## 2015-04-17 DIAGNOSIS — Z124 Encounter for screening for malignant neoplasm of cervix: Secondary | ICD-10-CM | POA: Diagnosis not present

## 2015-04-17 DIAGNOSIS — R351 Nocturia: Secondary | ICD-10-CM | POA: Diagnosis not present

## 2015-04-17 DIAGNOSIS — Z1231 Encounter for screening mammogram for malignant neoplasm of breast: Secondary | ICD-10-CM | POA: Diagnosis not present

## 2015-04-17 DIAGNOSIS — Z1272 Encounter for screening for malignant neoplasm of vagina: Secondary | ICD-10-CM | POA: Diagnosis not present

## 2015-07-03 DIAGNOSIS — L720 Epidermal cyst: Secondary | ICD-10-CM | POA: Diagnosis not present

## 2015-07-03 DIAGNOSIS — Z23 Encounter for immunization: Secondary | ICD-10-CM | POA: Diagnosis not present

## 2015-07-11 DIAGNOSIS — D2112 Benign neoplasm of connective and other soft tissue of left upper limb, including shoulder: Secondary | ICD-10-CM | POA: Diagnosis not present

## 2015-07-24 DIAGNOSIS — R2232 Localized swelling, mass and lump, left upper limb: Secondary | ICD-10-CM | POA: Diagnosis not present

## 2015-08-15 DIAGNOSIS — D1801 Hemangioma of skin and subcutaneous tissue: Secondary | ICD-10-CM | POA: Diagnosis not present

## 2015-08-15 DIAGNOSIS — R2232 Localized swelling, mass and lump, left upper limb: Secondary | ICD-10-CM | POA: Diagnosis not present

## 2015-08-15 DIAGNOSIS — D1739 Benign lipomatous neoplasm of skin and subcutaneous tissue of other sites: Secondary | ICD-10-CM | POA: Diagnosis not present

## 2015-08-15 DIAGNOSIS — D1722 Benign lipomatous neoplasm of skin and subcutaneous tissue of left arm: Secondary | ICD-10-CM | POA: Diagnosis not present

## 2015-08-30 DIAGNOSIS — Z4789 Encounter for other orthopedic aftercare: Secondary | ICD-10-CM | POA: Diagnosis not present

## 2015-08-30 DIAGNOSIS — R2232 Localized swelling, mass and lump, left upper limb: Secondary | ICD-10-CM | POA: Diagnosis not present

## 2015-09-27 DIAGNOSIS — R2232 Localized swelling, mass and lump, left upper limb: Secondary | ICD-10-CM | POA: Diagnosis not present

## 2015-09-27 DIAGNOSIS — Z4789 Encounter for other orthopedic aftercare: Secondary | ICD-10-CM | POA: Diagnosis not present

## 2015-10-05 DIAGNOSIS — R2232 Localized swelling, mass and lump, left upper limb: Secondary | ICD-10-CM | POA: Diagnosis not present

## 2015-10-26 DIAGNOSIS — Z1389 Encounter for screening for other disorder: Secondary | ICD-10-CM | POA: Diagnosis not present

## 2015-10-26 DIAGNOSIS — Z0000 Encounter for general adult medical examination without abnormal findings: Secondary | ICD-10-CM | POA: Diagnosis not present

## 2015-10-26 DIAGNOSIS — R7303 Prediabetes: Secondary | ICD-10-CM | POA: Diagnosis not present

## 2015-11-01 DIAGNOSIS — Z4789 Encounter for other orthopedic aftercare: Secondary | ICD-10-CM | POA: Diagnosis not present

## 2015-11-02 DIAGNOSIS — Z0000 Encounter for general adult medical examination without abnormal findings: Secondary | ICD-10-CM | POA: Diagnosis not present

## 2015-11-02 DIAGNOSIS — R7303 Prediabetes: Secondary | ICD-10-CM | POA: Diagnosis not present

## 2016-01-30 DIAGNOSIS — M5137 Other intervertebral disc degeneration, lumbosacral region: Secondary | ICD-10-CM | POA: Diagnosis not present

## 2016-01-30 DIAGNOSIS — M25562 Pain in left knee: Secondary | ICD-10-CM | POA: Diagnosis not present

## 2016-01-30 DIAGNOSIS — M9904 Segmental and somatic dysfunction of sacral region: Secondary | ICD-10-CM | POA: Diagnosis not present

## 2016-01-30 DIAGNOSIS — M170 Bilateral primary osteoarthritis of knee: Secondary | ICD-10-CM | POA: Diagnosis not present

## 2016-01-30 DIAGNOSIS — M9905 Segmental and somatic dysfunction of pelvic region: Secondary | ICD-10-CM | POA: Diagnosis not present

## 2016-01-30 DIAGNOSIS — M9903 Segmental and somatic dysfunction of lumbar region: Secondary | ICD-10-CM | POA: Diagnosis not present

## 2016-01-30 DIAGNOSIS — M4316 Spondylolisthesis, lumbar region: Secondary | ICD-10-CM | POA: Diagnosis not present

## 2016-01-30 DIAGNOSIS — Q72811 Congenital shortening of right lower limb: Secondary | ICD-10-CM | POA: Diagnosis not present

## 2016-02-01 DIAGNOSIS — Q72811 Congenital shortening of right lower limb: Secondary | ICD-10-CM | POA: Diagnosis not present

## 2016-02-01 DIAGNOSIS — M4316 Spondylolisthesis, lumbar region: Secondary | ICD-10-CM | POA: Diagnosis not present

## 2016-02-01 DIAGNOSIS — M9904 Segmental and somatic dysfunction of sacral region: Secondary | ICD-10-CM | POA: Diagnosis not present

## 2016-02-01 DIAGNOSIS — M9903 Segmental and somatic dysfunction of lumbar region: Secondary | ICD-10-CM | POA: Diagnosis not present

## 2016-02-01 DIAGNOSIS — M25562 Pain in left knee: Secondary | ICD-10-CM | POA: Diagnosis not present

## 2016-02-01 DIAGNOSIS — M170 Bilateral primary osteoarthritis of knee: Secondary | ICD-10-CM | POA: Diagnosis not present

## 2016-02-01 DIAGNOSIS — M5137 Other intervertebral disc degeneration, lumbosacral region: Secondary | ICD-10-CM | POA: Diagnosis not present

## 2016-02-01 DIAGNOSIS — M9905 Segmental and somatic dysfunction of pelvic region: Secondary | ICD-10-CM | POA: Diagnosis not present

## 2016-02-04 DIAGNOSIS — M9903 Segmental and somatic dysfunction of lumbar region: Secondary | ICD-10-CM | POA: Diagnosis not present

## 2016-02-04 DIAGNOSIS — M5137 Other intervertebral disc degeneration, lumbosacral region: Secondary | ICD-10-CM | POA: Diagnosis not present

## 2016-02-04 DIAGNOSIS — M4316 Spondylolisthesis, lumbar region: Secondary | ICD-10-CM | POA: Diagnosis not present

## 2016-02-04 DIAGNOSIS — M170 Bilateral primary osteoarthritis of knee: Secondary | ICD-10-CM | POA: Diagnosis not present

## 2016-02-04 DIAGNOSIS — M25562 Pain in left knee: Secondary | ICD-10-CM | POA: Diagnosis not present

## 2016-02-04 DIAGNOSIS — M9904 Segmental and somatic dysfunction of sacral region: Secondary | ICD-10-CM | POA: Diagnosis not present

## 2016-02-04 DIAGNOSIS — Q72811 Congenital shortening of right lower limb: Secondary | ICD-10-CM | POA: Diagnosis not present

## 2016-02-04 DIAGNOSIS — M9905 Segmental and somatic dysfunction of pelvic region: Secondary | ICD-10-CM | POA: Diagnosis not present

## 2016-02-06 DIAGNOSIS — M9904 Segmental and somatic dysfunction of sacral region: Secondary | ICD-10-CM | POA: Diagnosis not present

## 2016-02-06 DIAGNOSIS — M4316 Spondylolisthesis, lumbar region: Secondary | ICD-10-CM | POA: Diagnosis not present

## 2016-02-06 DIAGNOSIS — Q72811 Congenital shortening of right lower limb: Secondary | ICD-10-CM | POA: Diagnosis not present

## 2016-02-06 DIAGNOSIS — M9905 Segmental and somatic dysfunction of pelvic region: Secondary | ICD-10-CM | POA: Diagnosis not present

## 2016-02-06 DIAGNOSIS — M25562 Pain in left knee: Secondary | ICD-10-CM | POA: Diagnosis not present

## 2016-02-06 DIAGNOSIS — M5137 Other intervertebral disc degeneration, lumbosacral region: Secondary | ICD-10-CM | POA: Diagnosis not present

## 2016-02-06 DIAGNOSIS — M170 Bilateral primary osteoarthritis of knee: Secondary | ICD-10-CM | POA: Diagnosis not present

## 2016-02-06 DIAGNOSIS — M9903 Segmental and somatic dysfunction of lumbar region: Secondary | ICD-10-CM | POA: Diagnosis not present

## 2016-02-11 DIAGNOSIS — Q72811 Congenital shortening of right lower limb: Secondary | ICD-10-CM | POA: Diagnosis not present

## 2016-02-11 DIAGNOSIS — M5137 Other intervertebral disc degeneration, lumbosacral region: Secondary | ICD-10-CM | POA: Diagnosis not present

## 2016-02-11 DIAGNOSIS — M9904 Segmental and somatic dysfunction of sacral region: Secondary | ICD-10-CM | POA: Diagnosis not present

## 2016-02-11 DIAGNOSIS — M25562 Pain in left knee: Secondary | ICD-10-CM | POA: Diagnosis not present

## 2016-02-11 DIAGNOSIS — M9903 Segmental and somatic dysfunction of lumbar region: Secondary | ICD-10-CM | POA: Diagnosis not present

## 2016-02-11 DIAGNOSIS — M9905 Segmental and somatic dysfunction of pelvic region: Secondary | ICD-10-CM | POA: Diagnosis not present

## 2016-02-11 DIAGNOSIS — M4316 Spondylolisthesis, lumbar region: Secondary | ICD-10-CM | POA: Diagnosis not present

## 2016-02-11 DIAGNOSIS — M170 Bilateral primary osteoarthritis of knee: Secondary | ICD-10-CM | POA: Diagnosis not present

## 2016-02-13 DIAGNOSIS — M25562 Pain in left knee: Secondary | ICD-10-CM | POA: Diagnosis not present

## 2016-02-13 DIAGNOSIS — M4316 Spondylolisthesis, lumbar region: Secondary | ICD-10-CM | POA: Diagnosis not present

## 2016-02-13 DIAGNOSIS — Q72811 Congenital shortening of right lower limb: Secondary | ICD-10-CM | POA: Diagnosis not present

## 2016-02-13 DIAGNOSIS — M170 Bilateral primary osteoarthritis of knee: Secondary | ICD-10-CM | POA: Diagnosis not present

## 2016-02-13 DIAGNOSIS — M9905 Segmental and somatic dysfunction of pelvic region: Secondary | ICD-10-CM | POA: Diagnosis not present

## 2016-02-13 DIAGNOSIS — M9903 Segmental and somatic dysfunction of lumbar region: Secondary | ICD-10-CM | POA: Diagnosis not present

## 2016-02-13 DIAGNOSIS — M9904 Segmental and somatic dysfunction of sacral region: Secondary | ICD-10-CM | POA: Diagnosis not present

## 2016-02-13 DIAGNOSIS — M5137 Other intervertebral disc degeneration, lumbosacral region: Secondary | ICD-10-CM | POA: Diagnosis not present

## 2016-02-14 DIAGNOSIS — M4316 Spondylolisthesis, lumbar region: Secondary | ICD-10-CM | POA: Diagnosis not present

## 2016-02-14 DIAGNOSIS — M5137 Other intervertebral disc degeneration, lumbosacral region: Secondary | ICD-10-CM | POA: Diagnosis not present

## 2016-02-14 DIAGNOSIS — Q72811 Congenital shortening of right lower limb: Secondary | ICD-10-CM | POA: Diagnosis not present

## 2016-02-14 DIAGNOSIS — M25562 Pain in left knee: Secondary | ICD-10-CM | POA: Diagnosis not present

## 2016-02-14 DIAGNOSIS — M9905 Segmental and somatic dysfunction of pelvic region: Secondary | ICD-10-CM | POA: Diagnosis not present

## 2016-02-14 DIAGNOSIS — M9903 Segmental and somatic dysfunction of lumbar region: Secondary | ICD-10-CM | POA: Diagnosis not present

## 2016-02-14 DIAGNOSIS — M9904 Segmental and somatic dysfunction of sacral region: Secondary | ICD-10-CM | POA: Diagnosis not present

## 2016-02-14 DIAGNOSIS — M170 Bilateral primary osteoarthritis of knee: Secondary | ICD-10-CM | POA: Diagnosis not present

## 2016-02-18 DIAGNOSIS — M5137 Other intervertebral disc degeneration, lumbosacral region: Secondary | ICD-10-CM | POA: Diagnosis not present

## 2016-02-18 DIAGNOSIS — M25562 Pain in left knee: Secondary | ICD-10-CM | POA: Diagnosis not present

## 2016-02-18 DIAGNOSIS — M170 Bilateral primary osteoarthritis of knee: Secondary | ICD-10-CM | POA: Diagnosis not present

## 2016-02-18 DIAGNOSIS — Q72811 Congenital shortening of right lower limb: Secondary | ICD-10-CM | POA: Diagnosis not present

## 2016-02-18 DIAGNOSIS — M4316 Spondylolisthesis, lumbar region: Secondary | ICD-10-CM | POA: Diagnosis not present

## 2016-02-18 DIAGNOSIS — M9905 Segmental and somatic dysfunction of pelvic region: Secondary | ICD-10-CM | POA: Diagnosis not present

## 2016-02-18 DIAGNOSIS — M9904 Segmental and somatic dysfunction of sacral region: Secondary | ICD-10-CM | POA: Diagnosis not present

## 2016-02-18 DIAGNOSIS — M9903 Segmental and somatic dysfunction of lumbar region: Secondary | ICD-10-CM | POA: Diagnosis not present

## 2016-02-20 DIAGNOSIS — Q72811 Congenital shortening of right lower limb: Secondary | ICD-10-CM | POA: Diagnosis not present

## 2016-02-20 DIAGNOSIS — M25562 Pain in left knee: Secondary | ICD-10-CM | POA: Diagnosis not present

## 2016-02-20 DIAGNOSIS — M4316 Spondylolisthesis, lumbar region: Secondary | ICD-10-CM | POA: Diagnosis not present

## 2016-02-20 DIAGNOSIS — M5137 Other intervertebral disc degeneration, lumbosacral region: Secondary | ICD-10-CM | POA: Diagnosis not present

## 2016-02-20 DIAGNOSIS — M9903 Segmental and somatic dysfunction of lumbar region: Secondary | ICD-10-CM | POA: Diagnosis not present

## 2016-02-20 DIAGNOSIS — M170 Bilateral primary osteoarthritis of knee: Secondary | ICD-10-CM | POA: Diagnosis not present

## 2016-02-20 DIAGNOSIS — M9905 Segmental and somatic dysfunction of pelvic region: Secondary | ICD-10-CM | POA: Diagnosis not present

## 2016-02-20 DIAGNOSIS — M9904 Segmental and somatic dysfunction of sacral region: Secondary | ICD-10-CM | POA: Diagnosis not present

## 2016-02-21 DIAGNOSIS — M9903 Segmental and somatic dysfunction of lumbar region: Secondary | ICD-10-CM | POA: Diagnosis not present

## 2016-02-21 DIAGNOSIS — M4316 Spondylolisthesis, lumbar region: Secondary | ICD-10-CM | POA: Diagnosis not present

## 2016-02-21 DIAGNOSIS — Q72811 Congenital shortening of right lower limb: Secondary | ICD-10-CM | POA: Diagnosis not present

## 2016-02-21 DIAGNOSIS — M5137 Other intervertebral disc degeneration, lumbosacral region: Secondary | ICD-10-CM | POA: Diagnosis not present

## 2016-02-21 DIAGNOSIS — M9904 Segmental and somatic dysfunction of sacral region: Secondary | ICD-10-CM | POA: Diagnosis not present

## 2016-02-21 DIAGNOSIS — M25562 Pain in left knee: Secondary | ICD-10-CM | POA: Diagnosis not present

## 2016-02-21 DIAGNOSIS — M170 Bilateral primary osteoarthritis of knee: Secondary | ICD-10-CM | POA: Diagnosis not present

## 2016-02-21 DIAGNOSIS — M9905 Segmental and somatic dysfunction of pelvic region: Secondary | ICD-10-CM | POA: Diagnosis not present

## 2016-02-25 DIAGNOSIS — M9903 Segmental and somatic dysfunction of lumbar region: Secondary | ICD-10-CM | POA: Diagnosis not present

## 2016-02-25 DIAGNOSIS — M25562 Pain in left knee: Secondary | ICD-10-CM | POA: Diagnosis not present

## 2016-02-25 DIAGNOSIS — M9905 Segmental and somatic dysfunction of pelvic region: Secondary | ICD-10-CM | POA: Diagnosis not present

## 2016-02-25 DIAGNOSIS — M170 Bilateral primary osteoarthritis of knee: Secondary | ICD-10-CM | POA: Diagnosis not present

## 2016-02-25 DIAGNOSIS — M9904 Segmental and somatic dysfunction of sacral region: Secondary | ICD-10-CM | POA: Diagnosis not present

## 2016-02-25 DIAGNOSIS — M5137 Other intervertebral disc degeneration, lumbosacral region: Secondary | ICD-10-CM | POA: Diagnosis not present

## 2016-02-25 DIAGNOSIS — Q72811 Congenital shortening of right lower limb: Secondary | ICD-10-CM | POA: Diagnosis not present

## 2016-02-25 DIAGNOSIS — M4316 Spondylolisthesis, lumbar region: Secondary | ICD-10-CM | POA: Diagnosis not present

## 2016-02-27 DIAGNOSIS — M4316 Spondylolisthesis, lumbar region: Secondary | ICD-10-CM | POA: Diagnosis not present

## 2016-02-27 DIAGNOSIS — M25562 Pain in left knee: Secondary | ICD-10-CM | POA: Diagnosis not present

## 2016-02-27 DIAGNOSIS — M9905 Segmental and somatic dysfunction of pelvic region: Secondary | ICD-10-CM | POA: Diagnosis not present

## 2016-02-27 DIAGNOSIS — M9903 Segmental and somatic dysfunction of lumbar region: Secondary | ICD-10-CM | POA: Diagnosis not present

## 2016-02-27 DIAGNOSIS — Q72811 Congenital shortening of right lower limb: Secondary | ICD-10-CM | POA: Diagnosis not present

## 2016-02-27 DIAGNOSIS — M9904 Segmental and somatic dysfunction of sacral region: Secondary | ICD-10-CM | POA: Diagnosis not present

## 2016-02-27 DIAGNOSIS — M5137 Other intervertebral disc degeneration, lumbosacral region: Secondary | ICD-10-CM | POA: Diagnosis not present

## 2016-02-27 DIAGNOSIS — M170 Bilateral primary osteoarthritis of knee: Secondary | ICD-10-CM | POA: Diagnosis not present

## 2016-02-28 DIAGNOSIS — M5137 Other intervertebral disc degeneration, lumbosacral region: Secondary | ICD-10-CM | POA: Diagnosis not present

## 2016-02-28 DIAGNOSIS — M4316 Spondylolisthesis, lumbar region: Secondary | ICD-10-CM | POA: Diagnosis not present

## 2016-02-28 DIAGNOSIS — Q72811 Congenital shortening of right lower limb: Secondary | ICD-10-CM | POA: Diagnosis not present

## 2016-02-28 DIAGNOSIS — M9905 Segmental and somatic dysfunction of pelvic region: Secondary | ICD-10-CM | POA: Diagnosis not present

## 2016-02-28 DIAGNOSIS — M9903 Segmental and somatic dysfunction of lumbar region: Secondary | ICD-10-CM | POA: Diagnosis not present

## 2016-02-28 DIAGNOSIS — M170 Bilateral primary osteoarthritis of knee: Secondary | ICD-10-CM | POA: Diagnosis not present

## 2016-02-28 DIAGNOSIS — M9904 Segmental and somatic dysfunction of sacral region: Secondary | ICD-10-CM | POA: Diagnosis not present

## 2016-02-28 DIAGNOSIS — M25562 Pain in left knee: Secondary | ICD-10-CM | POA: Diagnosis not present

## 2016-03-03 DIAGNOSIS — M9905 Segmental and somatic dysfunction of pelvic region: Secondary | ICD-10-CM | POA: Diagnosis not present

## 2016-03-03 DIAGNOSIS — M9903 Segmental and somatic dysfunction of lumbar region: Secondary | ICD-10-CM | POA: Diagnosis not present

## 2016-03-03 DIAGNOSIS — M170 Bilateral primary osteoarthritis of knee: Secondary | ICD-10-CM | POA: Diagnosis not present

## 2016-03-03 DIAGNOSIS — M4316 Spondylolisthesis, lumbar region: Secondary | ICD-10-CM | POA: Diagnosis not present

## 2016-03-03 DIAGNOSIS — Q72811 Congenital shortening of right lower limb: Secondary | ICD-10-CM | POA: Diagnosis not present

## 2016-03-03 DIAGNOSIS — M25562 Pain in left knee: Secondary | ICD-10-CM | POA: Diagnosis not present

## 2016-03-03 DIAGNOSIS — M5137 Other intervertebral disc degeneration, lumbosacral region: Secondary | ICD-10-CM | POA: Diagnosis not present

## 2016-03-03 DIAGNOSIS — M9904 Segmental and somatic dysfunction of sacral region: Secondary | ICD-10-CM | POA: Diagnosis not present

## 2016-03-05 DIAGNOSIS — M9903 Segmental and somatic dysfunction of lumbar region: Secondary | ICD-10-CM | POA: Diagnosis not present

## 2016-03-05 DIAGNOSIS — M9905 Segmental and somatic dysfunction of pelvic region: Secondary | ICD-10-CM | POA: Diagnosis not present

## 2016-03-05 DIAGNOSIS — M9904 Segmental and somatic dysfunction of sacral region: Secondary | ICD-10-CM | POA: Diagnosis not present

## 2016-03-05 DIAGNOSIS — M25562 Pain in left knee: Secondary | ICD-10-CM | POA: Diagnosis not present

## 2016-03-05 DIAGNOSIS — M5137 Other intervertebral disc degeneration, lumbosacral region: Secondary | ICD-10-CM | POA: Diagnosis not present

## 2016-03-05 DIAGNOSIS — Q72811 Congenital shortening of right lower limb: Secondary | ICD-10-CM | POA: Diagnosis not present

## 2016-03-05 DIAGNOSIS — M4316 Spondylolisthesis, lumbar region: Secondary | ICD-10-CM | POA: Diagnosis not present

## 2016-03-05 DIAGNOSIS — M170 Bilateral primary osteoarthritis of knee: Secondary | ICD-10-CM | POA: Diagnosis not present

## 2016-03-07 DIAGNOSIS — M9903 Segmental and somatic dysfunction of lumbar region: Secondary | ICD-10-CM | POA: Diagnosis not present

## 2016-03-07 DIAGNOSIS — M9904 Segmental and somatic dysfunction of sacral region: Secondary | ICD-10-CM | POA: Diagnosis not present

## 2016-03-07 DIAGNOSIS — M170 Bilateral primary osteoarthritis of knee: Secondary | ICD-10-CM | POA: Diagnosis not present

## 2016-03-07 DIAGNOSIS — M4316 Spondylolisthesis, lumbar region: Secondary | ICD-10-CM | POA: Diagnosis not present

## 2016-03-07 DIAGNOSIS — M25562 Pain in left knee: Secondary | ICD-10-CM | POA: Diagnosis not present

## 2016-03-07 DIAGNOSIS — Q72811 Congenital shortening of right lower limb: Secondary | ICD-10-CM | POA: Diagnosis not present

## 2016-03-07 DIAGNOSIS — M9905 Segmental and somatic dysfunction of pelvic region: Secondary | ICD-10-CM | POA: Diagnosis not present

## 2016-03-07 DIAGNOSIS — M5137 Other intervertebral disc degeneration, lumbosacral region: Secondary | ICD-10-CM | POA: Diagnosis not present

## 2016-03-17 DIAGNOSIS — M4316 Spondylolisthesis, lumbar region: Secondary | ICD-10-CM | POA: Diagnosis not present

## 2016-03-17 DIAGNOSIS — M9905 Segmental and somatic dysfunction of pelvic region: Secondary | ICD-10-CM | POA: Diagnosis not present

## 2016-03-17 DIAGNOSIS — M9903 Segmental and somatic dysfunction of lumbar region: Secondary | ICD-10-CM | POA: Diagnosis not present

## 2016-03-17 DIAGNOSIS — M25562 Pain in left knee: Secondary | ICD-10-CM | POA: Diagnosis not present

## 2016-03-17 DIAGNOSIS — M9904 Segmental and somatic dysfunction of sacral region: Secondary | ICD-10-CM | POA: Diagnosis not present

## 2016-03-17 DIAGNOSIS — M170 Bilateral primary osteoarthritis of knee: Secondary | ICD-10-CM | POA: Diagnosis not present

## 2016-03-17 DIAGNOSIS — M5137 Other intervertebral disc degeneration, lumbosacral region: Secondary | ICD-10-CM | POA: Diagnosis not present

## 2016-03-17 DIAGNOSIS — Q72811 Congenital shortening of right lower limb: Secondary | ICD-10-CM | POA: Diagnosis not present

## 2016-03-19 DIAGNOSIS — Q72811 Congenital shortening of right lower limb: Secondary | ICD-10-CM | POA: Diagnosis not present

## 2016-03-19 DIAGNOSIS — M170 Bilateral primary osteoarthritis of knee: Secondary | ICD-10-CM | POA: Diagnosis not present

## 2016-03-19 DIAGNOSIS — M4316 Spondylolisthesis, lumbar region: Secondary | ICD-10-CM | POA: Diagnosis not present

## 2016-03-19 DIAGNOSIS — M9904 Segmental and somatic dysfunction of sacral region: Secondary | ICD-10-CM | POA: Diagnosis not present

## 2016-03-19 DIAGNOSIS — M9903 Segmental and somatic dysfunction of lumbar region: Secondary | ICD-10-CM | POA: Diagnosis not present

## 2016-03-19 DIAGNOSIS — M9905 Segmental and somatic dysfunction of pelvic region: Secondary | ICD-10-CM | POA: Diagnosis not present

## 2016-03-19 DIAGNOSIS — M25562 Pain in left knee: Secondary | ICD-10-CM | POA: Diagnosis not present

## 2016-03-19 DIAGNOSIS — M5137 Other intervertebral disc degeneration, lumbosacral region: Secondary | ICD-10-CM | POA: Diagnosis not present

## 2016-03-21 DIAGNOSIS — M4316 Spondylolisthesis, lumbar region: Secondary | ICD-10-CM | POA: Diagnosis not present

## 2016-03-21 DIAGNOSIS — M9903 Segmental and somatic dysfunction of lumbar region: Secondary | ICD-10-CM | POA: Diagnosis not present

## 2016-03-21 DIAGNOSIS — M9905 Segmental and somatic dysfunction of pelvic region: Secondary | ICD-10-CM | POA: Diagnosis not present

## 2016-03-21 DIAGNOSIS — M170 Bilateral primary osteoarthritis of knee: Secondary | ICD-10-CM | POA: Diagnosis not present

## 2016-03-21 DIAGNOSIS — M9904 Segmental and somatic dysfunction of sacral region: Secondary | ICD-10-CM | POA: Diagnosis not present

## 2016-03-21 DIAGNOSIS — Q72811 Congenital shortening of right lower limb: Secondary | ICD-10-CM | POA: Diagnosis not present

## 2016-03-21 DIAGNOSIS — M5137 Other intervertebral disc degeneration, lumbosacral region: Secondary | ICD-10-CM | POA: Diagnosis not present

## 2016-03-21 DIAGNOSIS — M25562 Pain in left knee: Secondary | ICD-10-CM | POA: Diagnosis not present

## 2016-03-24 DIAGNOSIS — M25562 Pain in left knee: Secondary | ICD-10-CM | POA: Diagnosis not present

## 2016-03-24 DIAGNOSIS — M170 Bilateral primary osteoarthritis of knee: Secondary | ICD-10-CM | POA: Diagnosis not present

## 2016-03-24 DIAGNOSIS — M9904 Segmental and somatic dysfunction of sacral region: Secondary | ICD-10-CM | POA: Diagnosis not present

## 2016-03-24 DIAGNOSIS — M9905 Segmental and somatic dysfunction of pelvic region: Secondary | ICD-10-CM | POA: Diagnosis not present

## 2016-03-24 DIAGNOSIS — M4316 Spondylolisthesis, lumbar region: Secondary | ICD-10-CM | POA: Diagnosis not present

## 2016-03-24 DIAGNOSIS — M5137 Other intervertebral disc degeneration, lumbosacral region: Secondary | ICD-10-CM | POA: Diagnosis not present

## 2016-03-24 DIAGNOSIS — Q72811 Congenital shortening of right lower limb: Secondary | ICD-10-CM | POA: Diagnosis not present

## 2016-03-24 DIAGNOSIS — M9903 Segmental and somatic dysfunction of lumbar region: Secondary | ICD-10-CM | POA: Diagnosis not present

## 2016-03-27 DIAGNOSIS — M170 Bilateral primary osteoarthritis of knee: Secondary | ICD-10-CM | POA: Diagnosis not present

## 2016-03-27 DIAGNOSIS — M4316 Spondylolisthesis, lumbar region: Secondary | ICD-10-CM | POA: Diagnosis not present

## 2016-03-27 DIAGNOSIS — M5137 Other intervertebral disc degeneration, lumbosacral region: Secondary | ICD-10-CM | POA: Diagnosis not present

## 2016-03-27 DIAGNOSIS — Q72811 Congenital shortening of right lower limb: Secondary | ICD-10-CM | POA: Diagnosis not present

## 2016-03-27 DIAGNOSIS — M25562 Pain in left knee: Secondary | ICD-10-CM | POA: Diagnosis not present

## 2016-03-27 DIAGNOSIS — M9905 Segmental and somatic dysfunction of pelvic region: Secondary | ICD-10-CM | POA: Diagnosis not present

## 2016-03-27 DIAGNOSIS — M9903 Segmental and somatic dysfunction of lumbar region: Secondary | ICD-10-CM | POA: Diagnosis not present

## 2016-03-27 DIAGNOSIS — M9904 Segmental and somatic dysfunction of sacral region: Secondary | ICD-10-CM | POA: Diagnosis not present

## 2016-04-01 DIAGNOSIS — M9903 Segmental and somatic dysfunction of lumbar region: Secondary | ICD-10-CM | POA: Diagnosis not present

## 2016-04-01 DIAGNOSIS — M5137 Other intervertebral disc degeneration, lumbosacral region: Secondary | ICD-10-CM | POA: Diagnosis not present

## 2016-04-01 DIAGNOSIS — M9905 Segmental and somatic dysfunction of pelvic region: Secondary | ICD-10-CM | POA: Diagnosis not present

## 2016-04-01 DIAGNOSIS — M170 Bilateral primary osteoarthritis of knee: Secondary | ICD-10-CM | POA: Diagnosis not present

## 2016-04-01 DIAGNOSIS — M9904 Segmental and somatic dysfunction of sacral region: Secondary | ICD-10-CM | POA: Diagnosis not present

## 2016-04-01 DIAGNOSIS — M4316 Spondylolisthesis, lumbar region: Secondary | ICD-10-CM | POA: Diagnosis not present

## 2016-04-01 DIAGNOSIS — Q72811 Congenital shortening of right lower limb: Secondary | ICD-10-CM | POA: Diagnosis not present

## 2016-04-01 DIAGNOSIS — M25562 Pain in left knee: Secondary | ICD-10-CM | POA: Diagnosis not present

## 2016-04-16 DIAGNOSIS — M9904 Segmental and somatic dysfunction of sacral region: Secondary | ICD-10-CM | POA: Diagnosis not present

## 2016-04-16 DIAGNOSIS — M25562 Pain in left knee: Secondary | ICD-10-CM | POA: Diagnosis not present

## 2016-04-16 DIAGNOSIS — M4316 Spondylolisthesis, lumbar region: Secondary | ICD-10-CM | POA: Diagnosis not present

## 2016-04-16 DIAGNOSIS — M5137 Other intervertebral disc degeneration, lumbosacral region: Secondary | ICD-10-CM | POA: Diagnosis not present

## 2016-04-16 DIAGNOSIS — M9903 Segmental and somatic dysfunction of lumbar region: Secondary | ICD-10-CM | POA: Diagnosis not present

## 2016-04-16 DIAGNOSIS — M9905 Segmental and somatic dysfunction of pelvic region: Secondary | ICD-10-CM | POA: Diagnosis not present

## 2016-04-16 DIAGNOSIS — M170 Bilateral primary osteoarthritis of knee: Secondary | ICD-10-CM | POA: Diagnosis not present

## 2016-04-16 DIAGNOSIS — Q72811 Congenital shortening of right lower limb: Secondary | ICD-10-CM | POA: Diagnosis not present

## 2016-04-22 DIAGNOSIS — M9905 Segmental and somatic dysfunction of pelvic region: Secondary | ICD-10-CM | POA: Diagnosis not present

## 2016-04-22 DIAGNOSIS — M170 Bilateral primary osteoarthritis of knee: Secondary | ICD-10-CM | POA: Diagnosis not present

## 2016-04-22 DIAGNOSIS — M25562 Pain in left knee: Secondary | ICD-10-CM | POA: Diagnosis not present

## 2016-04-22 DIAGNOSIS — M4316 Spondylolisthesis, lumbar region: Secondary | ICD-10-CM | POA: Diagnosis not present

## 2016-04-22 DIAGNOSIS — M5137 Other intervertebral disc degeneration, lumbosacral region: Secondary | ICD-10-CM | POA: Diagnosis not present

## 2016-04-22 DIAGNOSIS — M9903 Segmental and somatic dysfunction of lumbar region: Secondary | ICD-10-CM | POA: Diagnosis not present

## 2016-04-22 DIAGNOSIS — M9904 Segmental and somatic dysfunction of sacral region: Secondary | ICD-10-CM | POA: Diagnosis not present

## 2016-04-22 DIAGNOSIS — Q72811 Congenital shortening of right lower limb: Secondary | ICD-10-CM | POA: Diagnosis not present

## 2016-04-23 DIAGNOSIS — Z1231 Encounter for screening mammogram for malignant neoplasm of breast: Secondary | ICD-10-CM | POA: Diagnosis not present

## 2016-04-23 DIAGNOSIS — M8588 Other specified disorders of bone density and structure, other site: Secondary | ICD-10-CM | POA: Diagnosis not present

## 2016-04-23 DIAGNOSIS — Z6825 Body mass index (BMI) 25.0-25.9, adult: Secondary | ICD-10-CM | POA: Diagnosis not present

## 2016-04-23 DIAGNOSIS — Z01419 Encounter for gynecological examination (general) (routine) without abnormal findings: Secondary | ICD-10-CM | POA: Diagnosis not present

## 2016-04-23 DIAGNOSIS — N958 Other specified menopausal and perimenopausal disorders: Secondary | ICD-10-CM | POA: Diagnosis not present

## 2016-07-16 DIAGNOSIS — J069 Acute upper respiratory infection, unspecified: Secondary | ICD-10-CM | POA: Diagnosis not present

## 2016-10-22 DIAGNOSIS — L821 Other seborrheic keratosis: Secondary | ICD-10-CM | POA: Diagnosis not present

## 2016-10-31 DIAGNOSIS — Z1389 Encounter for screening for other disorder: Secondary | ICD-10-CM | POA: Diagnosis not present

## 2016-10-31 DIAGNOSIS — R739 Hyperglycemia, unspecified: Secondary | ICD-10-CM | POA: Diagnosis not present

## 2016-10-31 DIAGNOSIS — Z79899 Other long term (current) drug therapy: Secondary | ICD-10-CM | POA: Diagnosis not present

## 2016-10-31 DIAGNOSIS — H903 Sensorineural hearing loss, bilateral: Secondary | ICD-10-CM | POA: Diagnosis not present

## 2016-10-31 DIAGNOSIS — Z0000 Encounter for general adult medical examination without abnormal findings: Secondary | ICD-10-CM | POA: Diagnosis not present

## 2016-11-04 DIAGNOSIS — R739 Hyperglycemia, unspecified: Secondary | ICD-10-CM | POA: Diagnosis not present

## 2016-12-17 DIAGNOSIS — H2513 Age-related nuclear cataract, bilateral: Secondary | ICD-10-CM | POA: Diagnosis not present

## 2017-05-07 DIAGNOSIS — L821 Other seborrheic keratosis: Secondary | ICD-10-CM | POA: Diagnosis not present

## 2017-05-27 DIAGNOSIS — Z23 Encounter for immunization: Secondary | ICD-10-CM | POA: Diagnosis not present

## 2017-06-01 DIAGNOSIS — Z6821 Body mass index (BMI) 21.0-21.9, adult: Secondary | ICD-10-CM | POA: Diagnosis not present

## 2017-06-01 DIAGNOSIS — Z01419 Encounter for gynecological examination (general) (routine) without abnormal findings: Secondary | ICD-10-CM | POA: Diagnosis not present

## 2017-06-01 DIAGNOSIS — Z1231 Encounter for screening mammogram for malignant neoplasm of breast: Secondary | ICD-10-CM | POA: Diagnosis not present

## 2017-06-01 DIAGNOSIS — Z1272 Encounter for screening for malignant neoplasm of vagina: Secondary | ICD-10-CM | POA: Diagnosis not present

## 2017-06-01 DIAGNOSIS — Z124 Encounter for screening for malignant neoplasm of cervix: Secondary | ICD-10-CM | POA: Diagnosis not present

## 2017-11-02 DIAGNOSIS — R7309 Other abnormal glucose: Secondary | ICD-10-CM | POA: Diagnosis not present

## 2017-11-02 DIAGNOSIS — Z0000 Encounter for general adult medical examination without abnormal findings: Secondary | ICD-10-CM | POA: Diagnosis not present

## 2017-11-02 DIAGNOSIS — Z1389 Encounter for screening for other disorder: Secondary | ICD-10-CM | POA: Diagnosis not present

## 2018-06-03 DIAGNOSIS — Z01419 Encounter for gynecological examination (general) (routine) without abnormal findings: Secondary | ICD-10-CM | POA: Diagnosis not present

## 2018-06-03 DIAGNOSIS — Z6826 Body mass index (BMI) 26.0-26.9, adult: Secondary | ICD-10-CM | POA: Diagnosis not present

## 2018-06-03 DIAGNOSIS — N958 Other specified menopausal and perimenopausal disorders: Secondary | ICD-10-CM | POA: Diagnosis not present

## 2018-06-03 DIAGNOSIS — M8588 Other specified disorders of bone density and structure, other site: Secondary | ICD-10-CM | POA: Diagnosis not present

## 2018-06-03 DIAGNOSIS — Z1231 Encounter for screening mammogram for malignant neoplasm of breast: Secondary | ICD-10-CM | POA: Diagnosis not present

## 2018-07-12 ENCOUNTER — Encounter: Payer: Self-pay | Admitting: Neurology

## 2018-07-12 DIAGNOSIS — Z23 Encounter for immunization: Secondary | ICD-10-CM | POA: Diagnosis not present

## 2018-07-12 DIAGNOSIS — R4789 Other speech disturbances: Secondary | ICD-10-CM | POA: Diagnosis not present

## 2018-09-09 NOTE — Progress Notes (Signed)
NEUROLOGY CONSULTATION NOTE  Annette Wilson MRN: [id number] DOB: 02-24-1946  Referring provider: Lajean Manes, MD Primary care provider: Lajean Manes, MD  Reason for consult:  Language disorder/word-finding difficulty  HISTORY OF PRESENT ILLNESS: Annette Wilson is a 73 year old left-handed Caucasian woman who presents for language disorder.  History supplemented by referring provider's note.  Beginning around September 2019, the patient began having periodic episodes of language disorder.  In middle of conversation, she will hear what the other person is saying but couldn't respond.  She feels confused and has word-finding difficulty.  She has trouble focusing.  At the same time, she hears a background song with lyrics but cannot understand lyrics or identify it.  It lasts about a minute.  It may happen on the phone, in person or while working on the computer.  If she sees words on the screen, she will try reading them out loud and cannot talk fluently.  No preceding event except she was under a great amount of work-related stressors as well as emotional stress in regards to her daughter with schizoaffective disorder.  In the Fall, it happened about 10 times.  Since the New Year, it hasn't happened while conversing with other people, but it occasionally happens while working, where she loses her focus.  Separately, she has brief visual disturbance described as flashes and zigzag lines in her vision, lasting about 30 minutes.  This has occurred about 6 times.    She reports no prior history or migraines or seizures. She has a Ph.D in language and communication. Family history is notable for mother with Alzheimer's disease  PAST MEDICAL HISTORY: Prediabetes Chronic low back pain  Sensorineural hearing loss History of arrhythmia History of hepatitis   PAST SURGICAL HISTORY: None  MEDICATIONS: Estradiol  ALLERGIES: Aspirin Codeine Penicillin Duricef  FAMILY  HISTORY: Mother:  Alzheimer's disease  SOCIAL HISTORY: Marital status:  Married Alcohol:  Occasional glass of wine Tobacco/cigarettes:  Never used She has 2 daughters.  REVIEW OF SYSTEMS: Constitutional: No fevers, chills, or sweats, no generalized fatigue, change in appetite Eyes: No visual changes, double vision, eye pain Ear, nose and throat: No hearing loss, ear pain, nasal congestion, sore throat Cardiovascular: No chest pain, palpitations Respiratory:  No shortness of breath at rest or with exertion, wheezes GastrointestinaI: No nausea, vomiting, diarrhea, abdominal pain, fecal incontinence Genitourinary:  No dysuria, urinary retention or frequency Musculoskeletal:  No neck pain, back pain Integumentary: No rash, pruritus, skin lesions Neurological: as above Psychiatric: No depression, insomnia, anxiety Endocrine: No palpitations, fatigue, diaphoresis, mood swings, change in appetite, change in weight, increased thirst Hematologic/Lymphatic:  No purpura, petechiae. Allergic/Immunologic: no itchy/runny eyes, nasal congestion, recent allergic reactions, rashes  PHYSICAL EXAM: Blood pressure 106/64, pulse 62, height 5\' 4"  (1.626 m), weight 150 lb (68 kg), SpO2 98 %. General: No acute distress.  Patient appears well-groomed.  Head:  Normocephalic/atraumatic Eyes:  fundi examined but not visualized Neck: supple, no paraspinal tenderness, full range of motion Back: No paraspinal tenderness Heart: regular rate and rhythm Lungs: Clear to auscultation bilaterally. Vascular: No carotid bruits. Neurological Exam: Mental status: alert and oriented to person, place, and time, recent and remote memory intact, fund of knowledge intact, attention and concentration intact, speech fluent and not dysarthric, language intact. Cranial nerves: CN I: not tested CN II: pupils equal, round and reactive to light, visual fields intact CN III, IV, VI:  full range of motion, no nystagmus, no  ptosis CN V: facial sensation intact CN VII:  upper and lower face symmetric CN VIII: hearing intact CN IX, X: gag intact, uvula midline CN XI: sternocleidomastoid and trapezius muscles intact CN XII: tongue midline Bulk & Tone: normal, no fasciculations. Motor:  5/5 throughout  Sensation: temperature and vibration sensation intact. Deep Tendon Reflexes:  2+ throughout, toes downgoing.  Finger to nose testing:  Without dysmetria.  Heel to shin:  Without dysmetria.  Gait:  Normal station and stride.  Able to turn and tandem walk. Romberg negative.  IMPRESSION: Recurrent spells of confusion, language dysfunction, and auditory hallucinations.  Consider temporal lobe seizures versus complicated migraine without headache.  Unlikely to be TIA.  May have been aggravated by emotional stressors.  They have since pretty much resolved.  PLAN: 1.  MRI of brain with and without contrast 2.  EEG 3.  Further recommendations pending results.  Thank you for allowing me to take part in the care of this patient.  40 minutes spent face to face with patient, over 50% spent discussing diagnoses and workup.  Metta Clines, DO  CC: Lajean Manes, MD

## 2018-09-10 ENCOUNTER — Encounter: Payer: Self-pay | Admitting: Neurology

## 2018-09-10 ENCOUNTER — Ambulatory Visit (INDEPENDENT_AMBULATORY_CARE_PROVIDER_SITE_OTHER): Payer: Medicare Other | Admitting: Neurology

## 2018-09-10 VITALS — BP 106/64 | HR 62 | Ht 64.0 in | Wt 150.0 lb

## 2018-09-10 DIAGNOSIS — R479 Unspecified speech disturbances: Secondary | ICD-10-CM | POA: Diagnosis not present

## 2018-09-10 DIAGNOSIS — R440 Auditory hallucinations: Secondary | ICD-10-CM | POA: Diagnosis not present

## 2018-09-10 NOTE — Patient Instructions (Addendum)
1.  We will check MRI of brain with and without contrast 2.  We will check routine EEG 3.  Follow up after testing.  We have sent a referral to [facility] for your MRI and they will call you directly to schedule your appt. They are located at [place]. If you need to contact them directly please call [phone].

## 2018-09-10 NOTE — Addendum Note (Signed)
Addended by: Clois Comber on: 09/10/2018 03:31 PM   Modules accepted: Orders

## 2018-09-20 ENCOUNTER — Ambulatory Visit (INDEPENDENT_AMBULATORY_CARE_PROVIDER_SITE_OTHER): Payer: Medicare Other | Admitting: Neurology

## 2018-09-20 DIAGNOSIS — R479 Unspecified speech disturbances: Secondary | ICD-10-CM

## 2018-09-20 DIAGNOSIS — R440 Auditory hallucinations: Secondary | ICD-10-CM

## 2018-09-21 NOTE — Procedures (Signed)
ELECTROENCEPHALOGRAM REPORT  Date of Study: 09/20/2018  Patient's Name: Annette Wilson MRN: [id number] Date of Birth: 1946-04-25  Referring Provider: Metta Clines, DO  Clinical History: 73 year old female having her EEG for evaluation of  recurrent spells of confusion, language dysfunction, and auditory hallucinations.  Medications: Estradiol  Technical Summary: A multichannel digital EEG recording measured by the international 10-20 system with electrodes applied with paste and impedances below 5000 ohms performed in our laboratory with EKG monitoring in an awake and drowsy patient.  Hyperventilation was not performed. Photic stimulation was performed.  The digital EEG was referentially recorded, reformatted, and digitally filtered in a variety of bipolar and referential montages for optimal display.    Description: The patient is awake and drowsy during the recording.  During maximal wakefulness, there is a symmetric, medium voltage 9.5 Hz posterior dominant rhythm that attenuates with eye opening.  The record is symmetric.  During drowsiness and sleep, there is an increase in theta slowing of the background.  Vertex waves and symmetric sleep spindles were seen.  Photic stimulation did not elicit any abnormalities.  There were no epileptiform discharges or electrographic seizures seen.    EKG lead was unremarkable.  Impression: This awake and drowsy EEG is normal.    Clinical Correlation: A normal EEG does not exclude a clinical diagnosis of epilepsy.  If further clinical questions remain, prolonged EEG may be helpful.  Clinical correlation is advised.   Metta Clines, DO

## 2018-09-24 DIAGNOSIS — L821 Other seborrheic keratosis: Secondary | ICD-10-CM | POA: Diagnosis not present

## 2018-09-26 DIAGNOSIS — D496 Neoplasm of unspecified behavior of brain: Secondary | ICD-10-CM

## 2018-09-26 HISTORY — DX: Neoplasm of unspecified behavior of brain: D49.6

## 2018-09-29 ENCOUNTER — Telehealth: Payer: Self-pay

## 2018-09-29 NOTE — Telephone Encounter (Signed)
Attempted to reach Pt x 2, no VM

## 2018-09-29 NOTE — Telephone Encounter (Signed)
-----   Message from Pieter Partridge, DO sent at 09/21/2018  8:15 AM EST ----- EEG is normal.  MRI of brain would be next test.

## 2018-09-30 ENCOUNTER — Ambulatory Visit
Admission: RE | Admit: 2018-09-30 | Discharge: 2018-09-30 | Disposition: A | Discharge status: Home / Self Care | Payer: Medicare Other | Source: Ambulatory Visit | Attending: Neurology | Admitting: Neurology

## 2018-09-30 ENCOUNTER — Telehealth: Payer: Self-pay | Admitting: Neurology

## 2018-09-30 ENCOUNTER — Encounter: Payer: Self-pay | Admitting: Neurology

## 2018-09-30 DIAGNOSIS — R440 Auditory hallucinations: Secondary | ICD-10-CM

## 2018-09-30 DIAGNOSIS — G936 Cerebral edema: Secondary | ICD-10-CM | POA: Diagnosis not present

## 2018-09-30 DIAGNOSIS — R479 Unspecified speech disturbances: Secondary | ICD-10-CM

## 2018-09-30 MED ORDER — GADOBENATE DIMEGLUMINE 529 MG/ML IV SOLN
13.0000 mL | Freq: Once | INTRAVENOUS | Status: AC | PRN
  Administered 2018-09-30: 13 mL via INTRAVENOUS

## 2018-09-30 NOTE — Telephone Encounter (Signed)
I have been attempting to contact Annette Wilson with results of her brain MRI.  Home number rings once and then busy signal with no voice mail.  Work number provided is not in service.  Will send certified letter informing patient to contact our office for results and further recommendations.

## 2018-10-05 ENCOUNTER — Telehealth: Payer: Self-pay

## 2018-10-05 NOTE — Telephone Encounter (Signed)
Called home number, no answer, no VM. Called work number, was not in service.

## 2018-10-05 NOTE — Telephone Encounter (Signed)
-----   Message from Pieter Partridge, DO sent at 09/21/2018  8:15 AM EST ----- EEG is normal.  MRI of brain would be next test.

## 2018-10-06 ENCOUNTER — Telehealth: Payer: Self-pay

## 2018-10-06 NOTE — Telephone Encounter (Signed)
-----   Message from Pieter Partridge, DO sent at 09/21/2018  8:15 AM EST ----- EEG is normal.  MRI of brain would be next test.

## 2018-10-06 NOTE — Telephone Encounter (Signed)
Called, no VM, no answer

## 2018-10-12 NOTE — Telephone Encounter (Signed)
Pt called about MRI results. I advised her of normal EEG results, I am unable to access MRI results. Confirmed Pt's contact numbers. She did not have a explanation as to why we have been unable to reach her. Pt to call back tomorrow between 11:30a-12:00p

## 2018-10-13 NOTE — Telephone Encounter (Signed)
Annette Wilson returned our letter call-back request.  Relayed MRI results and reason to refer to neurosurgery.  I answered all questions to the best of my ability.

## 2018-10-15 DIAGNOSIS — D320 Benign neoplasm of cerebral meninges: Secondary | ICD-10-CM | POA: Diagnosis not present

## 2018-11-03 ENCOUNTER — Telehealth: Payer: Self-pay

## 2018-11-03 NOTE — Telephone Encounter (Signed)
-----   Message from Pieter Partridge, DO sent at 10/21/2018  8:30 AM EDT ----- Please let [name] know that I reviewed the notes by Dr. Zada Finders.  As these "episodes" may be small seizures, I would like to start Keppra 500mg  twice daily.  I would also like her to make a follow up appointment with me at a later date.

## 2018-11-03 NOTE — Telephone Encounter (Signed)
Called to advise Pt of recommendation. She said she was not sure she wanted to start a medication. She said she was on another line and would like to call me back when it was more convenient.

## 2018-11-19 ENCOUNTER — Encounter: Payer: Self-pay | Admitting: Gastroenterology

## 2018-11-22 DIAGNOSIS — Z1389 Encounter for screening for other disorder: Secondary | ICD-10-CM | POA: Diagnosis not present

## 2018-11-22 DIAGNOSIS — Z0000 Encounter for general adult medical examination without abnormal findings: Secondary | ICD-10-CM | POA: Diagnosis not present

## 2018-11-22 DIAGNOSIS — R7309 Other abnormal glucose: Secondary | ICD-10-CM | POA: Diagnosis not present

## 2018-11-22 DIAGNOSIS — Z91018 Allergy to other foods: Secondary | ICD-10-CM | POA: Diagnosis not present

## 2018-12-29 ENCOUNTER — Encounter: Payer: Self-pay | Admitting: Gastroenterology

## 2019-01-18 DIAGNOSIS — D320 Benign neoplasm of cerebral meninges: Secondary | ICD-10-CM | POA: Diagnosis not present

## 2019-02-24 ENCOUNTER — Other Ambulatory Visit: Payer: Self-pay | Admitting: Neurological Surgery

## 2019-02-25 ENCOUNTER — Other Ambulatory Visit (HOSPITAL_COMMUNITY): Payer: Self-pay | Admitting: Neurological Surgery

## 2019-02-25 ENCOUNTER — Other Ambulatory Visit: Payer: Self-pay | Admitting: Neurological Surgery

## 2019-02-25 DIAGNOSIS — D329 Benign neoplasm of meninges, unspecified: Secondary | ICD-10-CM

## 2019-03-14 ENCOUNTER — Encounter (HOSPITAL_COMMUNITY): Payer: Self-pay

## 2019-03-14 ENCOUNTER — Ambulatory Visit (HOSPITAL_COMMUNITY): Payer: Medicare Other

## 2019-03-16 ENCOUNTER — Other Ambulatory Visit (HOSPITAL_COMMUNITY): Payer: Medicare Other

## 2019-03-18 ENCOUNTER — Other Ambulatory Visit (HOSPITAL_COMMUNITY): Payer: Medicare Other

## 2019-03-22 ENCOUNTER — Inpatient Hospital Stay (HOSPITAL_COMMUNITY): Admission: RE | Admit: 2019-03-22 | Payer: Medicare Other | Source: Home / Self Care | Admitting: Neurological Surgery

## 2019-03-22 ENCOUNTER — Encounter (HOSPITAL_COMMUNITY): Admission: RE | Payer: Self-pay | Source: Home / Self Care

## 2019-03-22 SURGERY — CRANIOTOMY TUMOR EXCISION
Anesthesia: General

## 2019-04-22 DIAGNOSIS — Z23 Encounter for immunization: Secondary | ICD-10-CM | POA: Diagnosis not present

## 2019-10-08 ENCOUNTER — Ambulatory Visit: Payer: Medicare Other | Attending: Internal Medicine

## 2019-10-08 DIAGNOSIS — Z23 Encounter for immunization: Secondary | ICD-10-CM

## 2019-10-08 NOTE — Progress Notes (Signed)
   Covid-19 Vaccination Clinic  Name:  Kathrynne Ekblad    MRN: [id number] DOB: [birth year]  10/08/2019  [name] was observed post Covid-19 immunization for 30 minutes based on pre-vaccination screening without incident. She was provided with Vaccine Information Sheet and instruction to access the V-Safe system.   Ms. Ridl was instructed to call 911 with any severe reactions post vaccine: Marland Kitchen Difficulty breathing  . Swelling of face and throat  . A fast heartbeat  . A bad rash all over body  . Dizziness and weakness   Immunizations Administered    Name Date Dose VIS Date Route   Pfizer COVID-19 Vaccine 10/08/2019  2:20 PM 0.3 mL 07/08/2019 Intramuscular   Manufacturer: [place]   Lot: [id number]   [place]: [id number]

## 2019-11-01 ENCOUNTER — Ambulatory Visit: Payer: Medicare Other | Attending: Internal Medicine

## 2019-11-01 DIAGNOSIS — Z23 Encounter for immunization: Secondary | ICD-10-CM

## 2019-11-01 NOTE — Progress Notes (Signed)
   Covid-19 Vaccination Clinic  Name:  Annette Wilson    MRN: [id number] DOB: 1946-01-27  11/01/2019  [name] was observed post Covid-19 immunization for 15 minutes without incident. She was provided with Vaccine Information Sheet and instruction to access the V-Safe system.   [name] was instructed to call 911 with any severe reactions post vaccine: Marland Kitchen Difficulty breathing  . Swelling of face and throat  . A fast heartbeat  . A bad rash all over body  . Dizziness and weakness   Immunizations Administered    Name Date Dose VIS Date Route   Pfizer COVID-19 Vaccine 11/01/2019  2:41 PM 0.3 mL 07/08/2019 Intramuscular   Manufacturer: [place]   Lot: [id number]   [place]: [id number]

## 2019-11-23 DIAGNOSIS — Z79899 Other long term (current) drug therapy: Secondary | ICD-10-CM | POA: Diagnosis not present

## 2019-11-23 DIAGNOSIS — Z1211 Encounter for screening for malignant neoplasm of colon: Secondary | ICD-10-CM | POA: Diagnosis not present

## 2019-11-23 DIAGNOSIS — Z1389 Encounter for screening for other disorder: Secondary | ICD-10-CM | POA: Diagnosis not present

## 2019-11-23 DIAGNOSIS — G8929 Other chronic pain: Secondary | ICD-10-CM | POA: Diagnosis not present

## 2019-11-23 DIAGNOSIS — Z0000 Encounter for general adult medical examination without abnormal findings: Secondary | ICD-10-CM | POA: Diagnosis not present

## 2019-11-23 DIAGNOSIS — M25562 Pain in left knee: Secondary | ICD-10-CM | POA: Diagnosis not present

## 2019-11-23 DIAGNOSIS — R7303 Prediabetes: Secondary | ICD-10-CM | POA: Diagnosis not present

## 2019-11-23 DIAGNOSIS — D329 Benign neoplasm of meninges, unspecified: Secondary | ICD-10-CM | POA: Diagnosis not present

## 2019-11-23 DIAGNOSIS — M25561 Pain in right knee: Secondary | ICD-10-CM | POA: Diagnosis not present

## 2019-11-29 ENCOUNTER — Other Ambulatory Visit (HOSPITAL_COMMUNITY): Payer: Self-pay | Admitting: Neurological Surgery

## 2020-02-03 DIAGNOSIS — Z1211 Encounter for screening for malignant neoplasm of colon: Secondary | ICD-10-CM | POA: Diagnosis not present

## 2020-02-14 IMAGING — MR MR HEAD WO/W CM
13 series · 48 of 48 positions shown · IV contrast (multihance)
Comparison: None.

CLINICAL DATA: Speech disturbance.  Auditory hallucination.

Creatinine was obtained on site at [HOSPITAL] at [HOSPITAL].
Results: Creatinine 0.7 mg/dL.
EXAM:
MRI HEAD WITHOUT AND WITH CONTRAST
TECHNIQUE: Multiplanar, multiecho pulse sequences of the brain and surrounding
structures were obtained without and with intravenous contrast.
CONTRAST:  13mL MULTIHANCE GADOBENATE DIMEGLUMINE 529 MG/ML IV SOLN

[Series 2: T1 · sagittal · 5.0mm · 0.45mm/px · 2 of 21 slices shown]
[im 1/21]
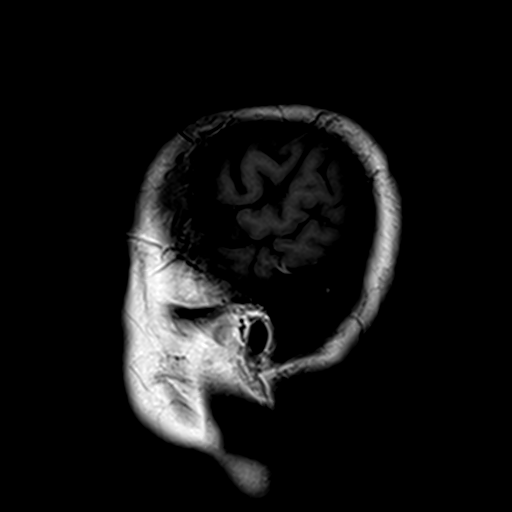
[im 21/21]
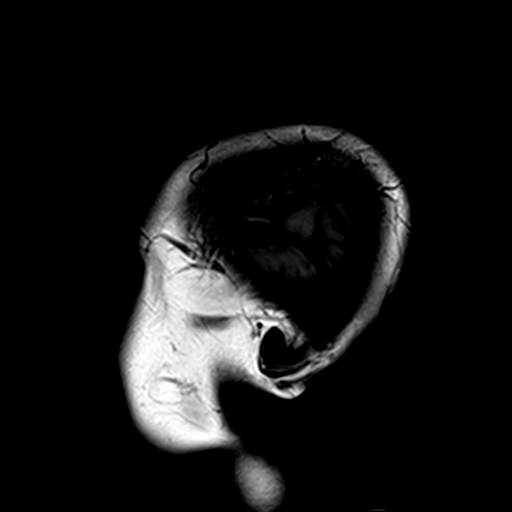

[Series 3: DWI · axial · 3.0mm · 1.80mm/px · z∈[-41,+105]mm · 7 of 99 slices shown (1 of 4)]
[im 1/99]
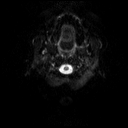
[im 17/99]
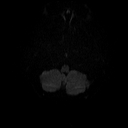
[im 33/99]
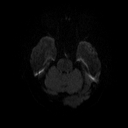
[im 50/99]
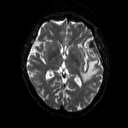
[im 66/99]
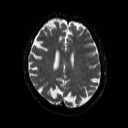
[im 82/99]
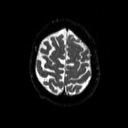
[im 99/99]
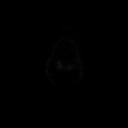

[Series 4: DWI · axial · 3.0mm · 1.80mm/px · z∈[-41,+105]mm · 3 of 50 slices shown (2 of 4)]
[im 1/50]
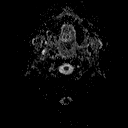
[im 25/50]
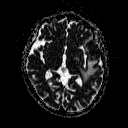
[im 50/50]
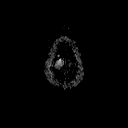

[Series 5: DWI · coronal · 5.0mm · 1.80mm/px · 4 of 66 slices shown (3 of 4)]
[im 1/66]
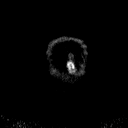
[im 22/66]
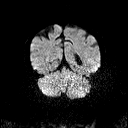
[im 44/66]
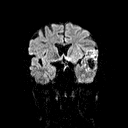
[im 66/66]
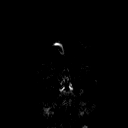

[Series 6: DWI · coronal · 5.0mm · 1.80mm/px · 2 of 34 slices shown (4 of 4)]
[im 1/34]
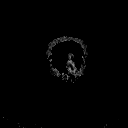
[im 34/34]
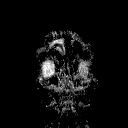

[Series 7: T2 · axial · 5.0mm · 0.60mm/px · 1 of 22 slices shown (1 of 2)]
[im 1/22]
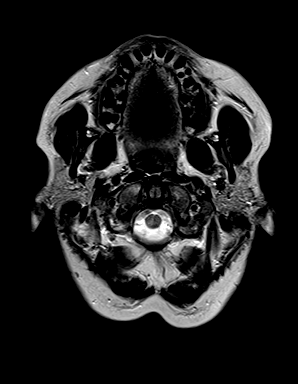

[Series 8: FLAIR · axial · 3.0mm · 0.45mm/px · z∈[-49,+99]mm · 2 of 33 slices shown]
[im 1/33]
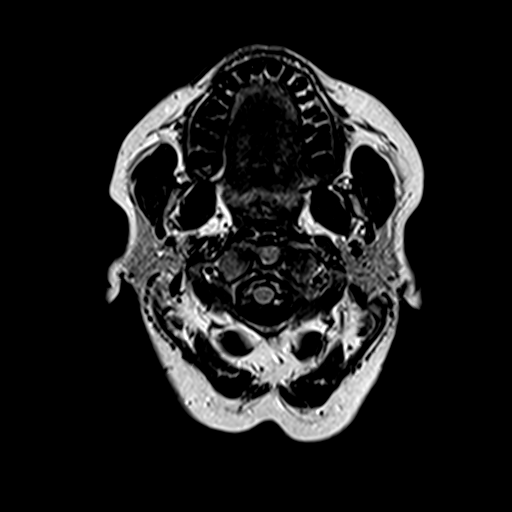
[im 33/33]
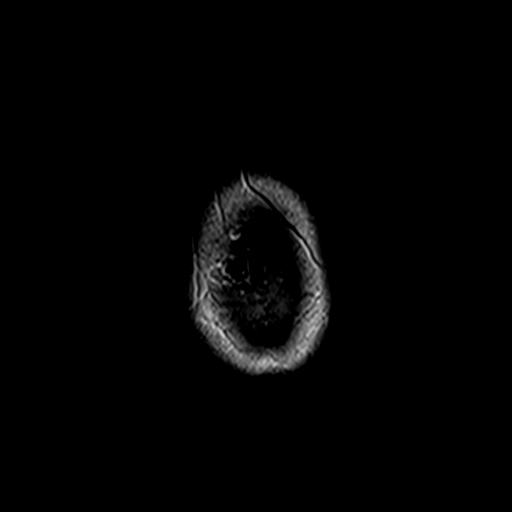

[Series 10: swi_images · axial · 4.0mm · 0.90mm/px · z∈[-45,+95]mm · 2 of 36 slices shown]
[im 1/36]
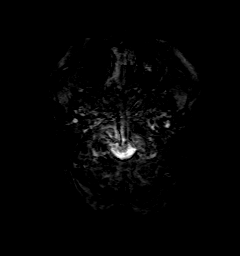
[im 36/36]
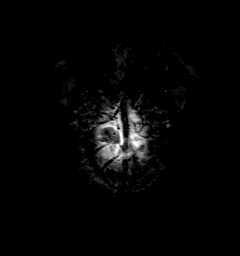

[Series 11: t1_mpr_tra · axial · 1.0mm · 0.75mm/px · z∈[-45,+97]mm · 10 of 144 slices shown (1 of 2)]
[im 1/144]
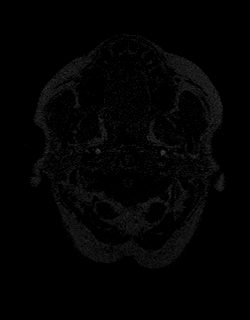
[im 16/144]
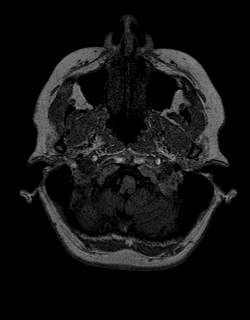
[im 32/144]
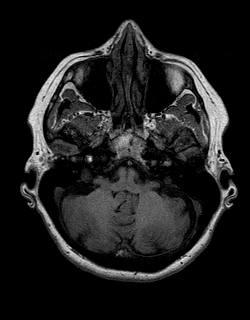
[im 48/144]
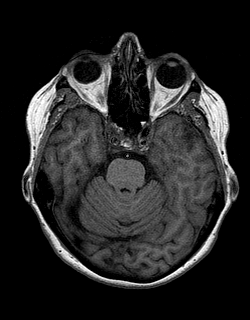
[im 64/144]
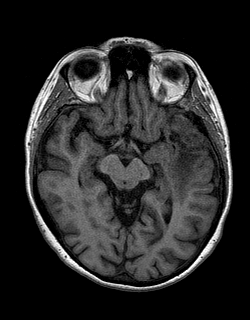
[im 80/144]
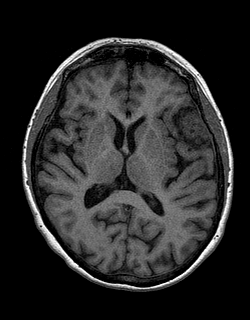
[im 96/144]
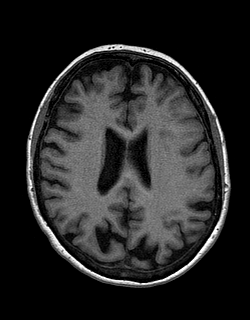
[im 112/144]
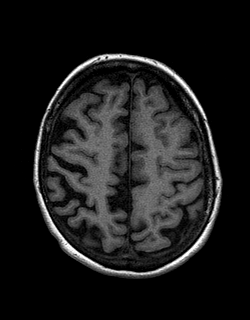
[im 128/144]
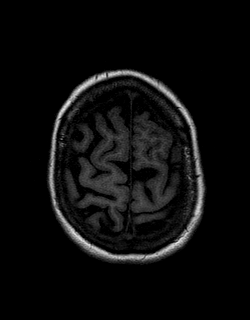
[im 144/144]
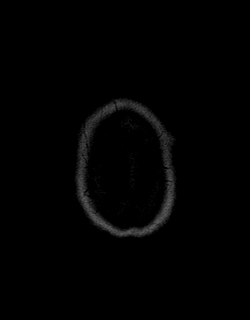

[Series 12: T2 · coronal · 5.0mm · 0.45mm/px · 2 of 25 slices shown (2 of 2)]
[im 1/25]
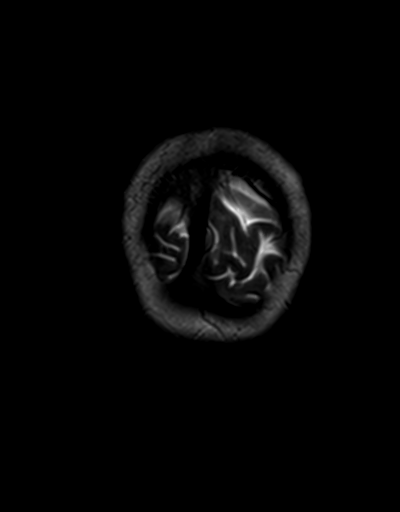
[im 25/25]
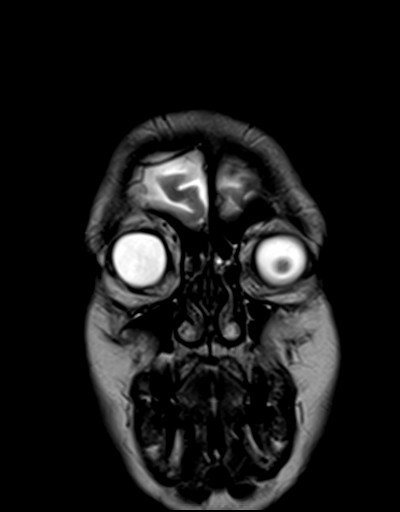

[Series 13: t1_mpr_tra · axial · 1.0mm · 0.75mm/px · z∈[-45,+97]mm · 10 of 144 slices shown (2 of 2)]
[im 1/144]
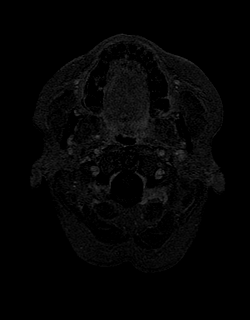
[im 16/144]
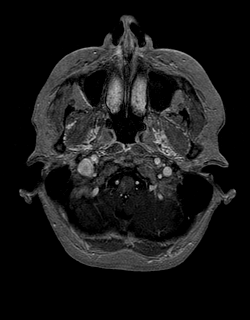
[im 32/144]
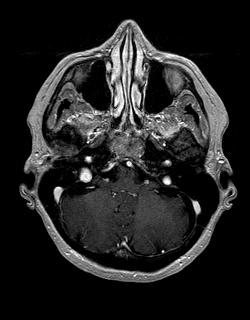
[im 48/144]
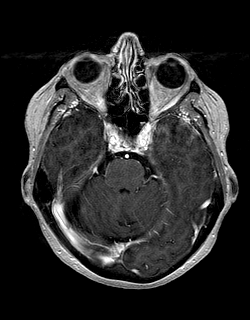
[im 64/144]
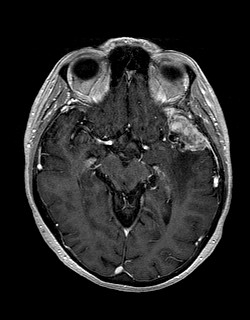
[im 80/144]
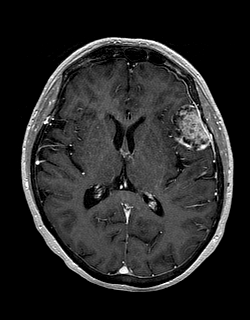
[im 96/144]
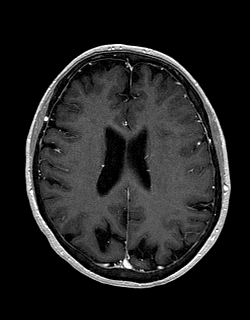
[im 112/144]
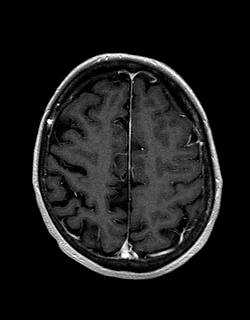
[im 128/144]
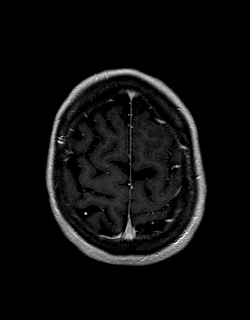
[im 144/144]
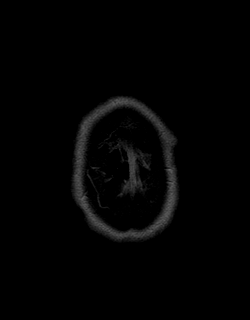

[Series 14: post cor · coronal · 5.0mm · 0.45mm/px · 2 of 25 slices shown]
[im 1/25]
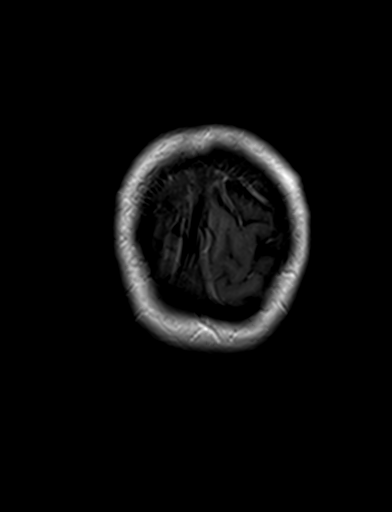
[im 25/25]
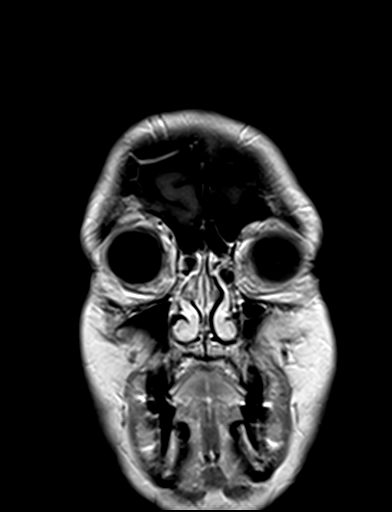

[Series 15: post sag (optional · sagittal · 5.0mm · 0.45mm/px · 1 of 21 slices shown]
[im 1/21]
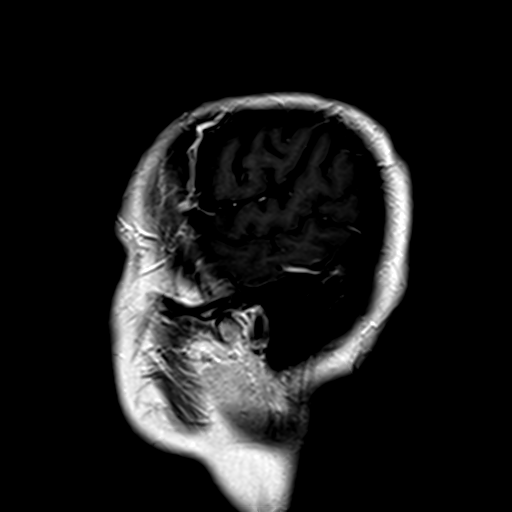

[48 of 48 positions shown; findings below may reference images not displayed]

FINDINGS: Brain: Enhancing extra-axial mass lesion left pterion. The mass has
irregular enhancement and appears partially calcified. The mass
lesion measures 33 x 24 x 41 mm. There is adjacent hyperostosis of
the skull. Mild edema in the adjacent left temporal lobe. Lesion is
heavily calcified. Small cysts are present within the lesion. Local
mass-effect. 3 mm midline shift to the right.

Ventricle size normal.  Negative for acute or chronic infarct.

Vascular: Normal arterial flow voids.

Skull and upper cervical spine: Hyperostosis of the left temporal
bone due to meningioma.

Sinuses/Orbits: Mild mucosal edema paranasal sinuses.  Normal orbit

Other: None
IMPRESSION: Findings compatible with meningioma in the left pterion. The mass
shows irregular enhancement appears heavily calcified. Mild edema.
Mild midline shift.

These results were called by telephone at the time of interpretation
on 09/30/2018 at [DATE] to Dr. MATTHIEU JAFFE , who verbally acknowledged
these results.

## 2020-05-10 DIAGNOSIS — Z23 Encounter for immunization: Secondary | ICD-10-CM | POA: Diagnosis not present

## 2020-05-26 ENCOUNTER — Ambulatory Visit: Payer: Medicare Other | Attending: Internal Medicine

## 2020-05-26 DIAGNOSIS — Z23 Encounter for immunization: Secondary | ICD-10-CM

## 2020-05-26 NOTE — Progress Notes (Signed)
   Covid-19 Vaccination Clinic  Name:  [name]    MRN: [id number] DOB: [birth year]  05/26/2020  Annette Wilson was observed post Covid-19 immunization for 15 minutes without incident. She was provided with Vaccine Information Sheet and instruction to access the V-Safe system.   [name] was instructed to call 911 with any severe reactions post vaccine: Marland Kitchen Difficulty breathing  . Swelling of face and throat  . A fast heartbeat  . A bad rash all over body  . Dizziness and weakness

## 2020-06-05 DIAGNOSIS — M8588 Other specified disorders of bone density and structure, other site: Secondary | ICD-10-CM | POA: Diagnosis not present

## 2020-06-05 DIAGNOSIS — N958 Other specified menopausal and perimenopausal disorders: Secondary | ICD-10-CM | POA: Diagnosis not present

## 2020-11-23 DIAGNOSIS — Z79899 Other long term (current) drug therapy: Secondary | ICD-10-CM | POA: Diagnosis not present

## 2020-11-23 DIAGNOSIS — D329 Benign neoplasm of meninges, unspecified: Secondary | ICD-10-CM | POA: Diagnosis not present

## 2020-11-23 DIAGNOSIS — E8809 Other disorders of plasma-protein metabolism, not elsewhere classified: Secondary | ICD-10-CM | POA: Diagnosis not present

## 2020-11-23 DIAGNOSIS — M71349 Other bursal cyst, unspecified hand: Secondary | ICD-10-CM | POA: Diagnosis not present

## 2020-11-23 DIAGNOSIS — R7303 Prediabetes: Secondary | ICD-10-CM | POA: Diagnosis not present

## 2020-11-23 DIAGNOSIS — Z0000 Encounter for general adult medical examination without abnormal findings: Secondary | ICD-10-CM | POA: Diagnosis not present

## 2020-12-31 DIAGNOSIS — Z23 Encounter for immunization: Secondary | ICD-10-CM | POA: Diagnosis not present

## 2021-01-30 DIAGNOSIS — Z1231 Encounter for screening mammogram for malignant neoplasm of breast: Secondary | ICD-10-CM | POA: Diagnosis not present

## 2021-01-30 DIAGNOSIS — Z01419 Encounter for gynecological examination (general) (routine) without abnormal findings: Secondary | ICD-10-CM | POA: Diagnosis not present

## 2021-01-30 DIAGNOSIS — Z6826 Body mass index (BMI) 26.0-26.9, adult: Secondary | ICD-10-CM | POA: Diagnosis not present

## 2021-05-10 DIAGNOSIS — Z23 Encounter for immunization: Secondary | ICD-10-CM | POA: Diagnosis not present

## 2021-05-13 DIAGNOSIS — Z23 Encounter for immunization: Secondary | ICD-10-CM | POA: Diagnosis not present

## 2021-05-20 DIAGNOSIS — H353131 Nonexudative age-related macular degeneration, bilateral, early dry stage: Secondary | ICD-10-CM | POA: Diagnosis not present

## 2021-05-20 DIAGNOSIS — H5213 Myopia, bilateral: Secondary | ICD-10-CM | POA: Diagnosis not present

## 2021-11-29 DIAGNOSIS — Z0000 Encounter for general adult medical examination without abnormal findings: Secondary | ICD-10-CM | POA: Diagnosis not present

## 2021-11-29 DIAGNOSIS — Z79899 Other long term (current) drug therapy: Secondary | ICD-10-CM | POA: Diagnosis not present

## 2021-11-29 DIAGNOSIS — R7303 Prediabetes: Secondary | ICD-10-CM | POA: Diagnosis not present

## 2021-11-29 DIAGNOSIS — D329 Benign neoplasm of meninges, unspecified: Secondary | ICD-10-CM | POA: Diagnosis not present

## 2021-11-29 DIAGNOSIS — Z1331 Encounter for screening for depression: Secondary | ICD-10-CM | POA: Diagnosis not present

## 2022-02-07 DIAGNOSIS — Z1151 Encounter for screening for human papillomavirus (HPV): Secondary | ICD-10-CM | POA: Diagnosis not present

## 2022-02-07 DIAGNOSIS — Z1231 Encounter for screening mammogram for malignant neoplasm of breast: Secondary | ICD-10-CM | POA: Diagnosis not present

## 2022-02-07 DIAGNOSIS — Z6825 Body mass index (BMI) 25.0-25.9, adult: Secondary | ICD-10-CM | POA: Diagnosis not present

## 2022-02-07 DIAGNOSIS — Z124 Encounter for screening for malignant neoplasm of cervix: Secondary | ICD-10-CM | POA: Diagnosis not present

## 2022-02-10 DIAGNOSIS — Z0000 Encounter for general adult medical examination without abnormal findings: Secondary | ICD-10-CM | POA: Diagnosis not present

## 2022-03-26 ENCOUNTER — Other Ambulatory Visit (HOSPITAL_COMMUNITY): Payer: Self-pay | Admitting: Neurological Surgery

## 2022-03-26 ENCOUNTER — Telehealth (HOSPITAL_BASED_OUTPATIENT_CLINIC_OR_DEPARTMENT_OTHER): Payer: Self-pay

## 2022-03-26 ENCOUNTER — Other Ambulatory Visit: Payer: Self-pay | Admitting: Neurological Surgery

## 2022-03-26 DIAGNOSIS — D320 Benign neoplasm of cerebral meninges: Secondary | ICD-10-CM

## 2022-03-30 ENCOUNTER — Ambulatory Visit (HOSPITAL_COMMUNITY)
Admission: RE | Admit: 2022-03-30 | Discharge: 2022-03-30 | Disposition: A | Discharge status: Home / Self Care | Payer: Medicare Other | Source: Ambulatory Visit | Attending: Neurological Surgery | Admitting: Neurological Surgery

## 2022-03-30 DIAGNOSIS — D320 Benign neoplasm of cerebral meninges: Secondary | ICD-10-CM | POA: Diagnosis not present

## 2022-03-30 DIAGNOSIS — G9389 Other specified disorders of brain: Secondary | ICD-10-CM | POA: Diagnosis not present

## 2022-03-30 MED ORDER — GADOBUTROL 1 MMOL/ML IV SOLN
7.0000 mL | Freq: Once | INTRAVENOUS | Status: AC | PRN
  Administered 2022-03-30: 7 mL via INTRAVENOUS

## 2022-04-17 DIAGNOSIS — D320 Benign neoplasm of cerebral meninges: Secondary | ICD-10-CM | POA: Diagnosis not present

## 2022-04-18 ENCOUNTER — Other Ambulatory Visit: Payer: Self-pay | Admitting: Neurological Surgery

## 2022-04-21 DIAGNOSIS — Z23 Encounter for immunization: Secondary | ICD-10-CM | POA: Diagnosis not present

## 2022-05-01 DIAGNOSIS — Z23 Encounter for immunization: Secondary | ICD-10-CM | POA: Diagnosis not present

## 2022-05-21 DIAGNOSIS — H5213 Myopia, bilateral: Secondary | ICD-10-CM | POA: Diagnosis not present

## 2022-05-21 DIAGNOSIS — H353131 Nonexudative age-related macular degeneration, bilateral, early dry stage: Secondary | ICD-10-CM | POA: Diagnosis not present

## 2022-05-21 DIAGNOSIS — D320 Benign neoplasm of cerebral meninges: Secondary | ICD-10-CM | POA: Diagnosis not present

## 2022-05-21 NOTE — Pre-Procedure Instructions (Signed)
Surgical Instructions    Your procedure is scheduled on Monday, November 6th.  Report to [facility] Main Entrance "A" at 05:30 A.M., then check in with the Admitting office.  Call this number if you have problems the morning of surgery:  [phone]   If you have any questions prior to your surgery date call [phone]: Open Monday-Friday 8am-4pm    Remember:  Do not eat after midnight the night before your surgery  You may drink clear liquids until 04:30 AM the morning of your surgery.   Clear liquids allowed are: Water, Non-Citrus Juices (without pulp), Carbonated Beverages, Clear Tea, Black Coffee Only (NO MILK, CREAM OR POWDERED CREAMER of any kind), and Gatorade.    Take these medicines the morning of surgery with A SIP OF WATER   If needed: acetaminophen (TYLENOL) EPINEPHrine  As of today, STOP taking any Aspirin (unless otherwise instructed by your surgeon) Aleve, Naproxen, Ibuprofen, Motrin, Advil, Goody's, BC's, all herbal medications, fish oil, and all vitamins.                     Do NOT Smoke (Tobacco/Vaping) for 24 hours prior to your procedure.  If you use a CPAP at night, you may bring your mask/headgear for your overnight stay.   Contacts, glasses, piercing's, hearing aid's, dentures or partials may not be worn into surgery, please bring cases for these belongings.    For patients admitted to the hospital, discharge time will be determined by your treatment team.   Patients discharged the day of surgery will not be allowed to drive home, and someone needs to stay with them for 24 hours.  SURGICAL WAITING ROOM VISITATION Patients having surgery or a procedure may have no more than 2 support people in the waiting area - these visitors may rotate.   Children under the age of 62 must have an adult with them who is not the patient. If the patient needs to stay at the hospital during part of their recovery, the visitor guidelines for inpatient rooms apply. Pre-op  nurse will coordinate an appropriate time for 1 support person to accompany patient in pre-op.  This support person may not rotate.   Please refer to the [facility] website for the visitor guidelines for Inpatients (after your surgery is over and you are in a regular room).    Special instructions:   [place]- Preparing For Surgery  Before surgery, you can play an important role. Because skin is not sterile, your skin needs to be as free of germs as possible. You can reduce the number of germs on your skin by washing with CHG (chlorahexidine gluconate) Soap before surgery.  CHG is an antiseptic cleaner which kills germs and bonds with the skin to continue killing germs even after washing.    Oral Hygiene is also important to reduce your risk of infection.  Remember - BRUSH YOUR TEETH THE MORNING OF SURGERY WITH YOUR REGULAR TOOTHPASTE  Please do not use if you have an allergy to CHG or antibacterial soaps. If your skin becomes reddened/irritated stop using the CHG.  Do not shave (including legs and underarms) for at least 48 hours prior to first CHG shower. It is OK to shave your face.  Please follow these instructions carefully.   Shower the NIGHT BEFORE SURGERY and the MORNING OF SURGERY  If you chose to wash your hair, wash your hair first as usual with your normal shampoo.  After you shampoo, rinse your hair and  body thoroughly to remove the shampoo.  Use CHG Soap as you would any other liquid soap. You can apply CHG directly to the skin and wash gently with a scrungie or a clean washcloth.   Apply the CHG Soap to your body ONLY FROM THE NECK DOWN.  Do not use on open wounds or open sores. Avoid contact with your eyes, ears, mouth and genitals (private parts). Wash Face and genitals (private parts)  with your normal soap.   Wash thoroughly, paying special attention to the area where your surgery will be performed.  Thoroughly rinse your body with warm water from the neck  down.  DO NOT shower/wash with your normal soap after using and rinsing off the CHG Soap.  Pat yourself dry with a CLEAN TOWEL.  Wear CLEAN PAJAMAS to bed the night before surgery  Place CLEAN SHEETS on your bed the night before your surgery  DO NOT SLEEP WITH PETS.   Day of Surgery: Take a shower with CHG soap. Do not wear jewelry or makeup Do not wear lotions, powders, perfumes, or deodorant. Do not shave 48 hours prior to surgery.   Do not bring valuables to the hospital. [facility] is not responsible for any belongings or valuables. Do not wear nail polish, gel polish, artificial nails, or any other type of covering on natural nails (fingers and toes) If you have artificial nails or gel coating that need to be removed by a nail salon, please have this removed prior to surgery. Artificial nails or gel coating may interfere with anesthesia's ability to adequately monitor your vital signs. Wear Clean/Comfortable clothing the morning of surgery Remember to brush your teeth WITH YOUR REGULAR TOOTHPASTE.   Please read over the following fact sheets that you were given.    If you received a COVID test during your pre-op visit  it is requested that you wear a mask when out in public, stay away from anyone that may not be feeling well and notify your surgeon if you develop symptoms. If you have been in contact with anyone that has tested positive in the last 10 days please notify you surgeon.

## 2022-05-22 ENCOUNTER — Encounter (HOSPITAL_COMMUNITY)
Admission: RE | Admit: 2022-05-22 | Discharge: 2022-05-22 | Disposition: A | Discharge status: Home / Self Care | Payer: Medicare Other | Source: Ambulatory Visit | Attending: Neurological Surgery | Admitting: Neurological Surgery

## 2022-05-22 ENCOUNTER — Other Ambulatory Visit: Payer: Self-pay | Admitting: Neurological Surgery

## 2022-05-22 ENCOUNTER — Encounter (HOSPITAL_COMMUNITY): Payer: Self-pay

## 2022-05-22 ENCOUNTER — Other Ambulatory Visit: Payer: Self-pay

## 2022-05-22 VITALS — BP 148/71 | HR 58 | Temp 97.6°F | Resp 18 | Ht 64.0 in | Wt 143.8 lb

## 2022-05-22 DIAGNOSIS — Z01812 Encounter for preprocedural laboratory examination: Secondary | ICD-10-CM | POA: Insufficient documentation

## 2022-05-22 DIAGNOSIS — Z01818 Encounter for other preprocedural examination: Secondary | ICD-10-CM

## 2022-05-22 HISTORY — DX: Scarlet fever, uncomplicated: A38.9

## 2022-05-22 LAB — TYPE AND SCREEN
ABO/RH(D): A NEG
Antibody Screen: NEGATIVE

## 2022-05-22 LAB — CBC
HCT: 42.1 % (ref 36.0–46.0)
Hemoglobin: 13.8 g/dL (ref 12.0–15.0)
MCH: 29.7 pg (ref 26.0–34.0)
MCHC: 32.8 g/dL (ref 30.0–36.0)
MCV: 90.7 fL (ref 80.0–100.0)
Platelets: 201 10*3/uL (ref 150–400)
RBC: 4.64 MIL/uL (ref 3.87–5.11)
RDW: 12.8 % (ref 11.5–15.5)
WBC: 6 10*3/uL (ref 4.0–10.5)
nRBC: 0 % (ref 0.0–0.2)

## 2022-05-22 NOTE — Progress Notes (Signed)
PCP - Hal Stoneking just retired will now be with new PCP with Animal nutritionist at [employer] - Denies  PPM/ICD - Denies Device Orders -  Rep Notified -   Chest x-ray - NI EKG - NI Stress Test - Denies ECHO - Denies Cardiac Cath - Denies  Sleep Study - Denies  DM - Denies  Blood Thinner Instructions:Denies Aspirin Instructions:Denies  ERAS Protcol - Yes   COVID TEST- NI   Anesthesia review: No  Patient denies shortness of breath, fever, cough and chest pain at PAT appointment   All instructions explained to the patient, with a verbal understanding of the material. Patient agrees to go over the instructions while at home for a better understanding.  The opportunity to ask questions was provided.

## 2022-05-22 NOTE — Progress Notes (Signed)
At times patient suffers from aphasia and will be unable to speak or write.  She wants Dr. Zada Finders to speak to her husband after surgery on 06/02/22.

## 2022-05-22 NOTE — Progress Notes (Signed)
1249 Lab called to report patient's CMP hemolyzed. Will need to recollect DOS.

## 2022-06-01 NOTE — Anesthesia Preprocedure Evaluation (Addendum)
Anesthesia Evaluation  Patient identified by MRN, date of birth, ID band Patient awake    Reviewed: Allergy & Precautions, NPO status , Patient's Chart, lab work & pertinent test results  History of Anesthesia Complications Negative for: history of anesthetic complications  Airway Mallampati: II  TM Distance: >3 FB Neck ROM: Full    Dental  (+) Dental Advisory Given   Pulmonary neg pulmonary ROS   Pulmonary exam normal        Cardiovascular negative cardio ROS Normal cardiovascular exam     Neuro/Psych Meningioma    GI/Hepatic Neg liver ROS, PUD,,,  Endo/Other  negative endocrine ROS    Renal/GU negative Renal ROS  negative genitourinary   Musculoskeletal negative musculoskeletal ROS (+)    Abdominal   Peds  Hematology negative hematology ROS (+)   Anesthesia Other Findings   Reproductive/Obstetrics                             Anesthesia Physical Anesthesia Plan  ASA: 2  Anesthesia Plan: General   Post-op Pain Management: Tylenol PO (pre-op)*   Induction: Intravenous  PONV Risk Score and Plan: 3 and Ondansetron, Dexamethasone and Treatment may vary due to age or medical condition  Airway Management Planned: Oral ETT  Additional Equipment: Arterial line  Intra-op Plan:   Post-operative Plan: Extubation in OR  Informed Consent: I have reviewed the patients History and Physical, chart, labs and discussed the procedure including the risks, benefits and alternatives for the proposed anesthesia with the patient or authorized representative who has indicated his/her understanding and acceptance.     Dental advisory given  Plan Discussed with:   Anesthesia Plan Comments:        Anesthesia Quick Evaluation

## 2022-06-02 ENCOUNTER — Inpatient Hospital Stay (HOSPITAL_COMMUNITY): Payer: Medicare Other | Admitting: Anesthesiology

## 2022-06-02 ENCOUNTER — Inpatient Hospital Stay (HOSPITAL_COMMUNITY): Payer: Medicare Other

## 2022-06-02 ENCOUNTER — Inpatient Hospital Stay (HOSPITAL_COMMUNITY)
Admission: RE | Disposition: A | Discharge status: Home / Self Care | Payer: Self-pay | Source: Home / Self Care | Attending: Neurological Surgery

## 2022-06-02 ENCOUNTER — Inpatient Hospital Stay (HOSPITAL_COMMUNITY)
Admission: RE | Admit: 2022-06-02 | Discharge: 2022-06-04 | DRG: 026 | Disposition: A | Discharge status: Home / Self Care | Payer: Medicare Other | Attending: Neurological Surgery | Admitting: Neurological Surgery

## 2022-06-02 ENCOUNTER — Other Ambulatory Visit: Payer: Self-pay

## 2022-06-02 DIAGNOSIS — D330 Benign neoplasm of brain, supratentorial: Secondary | ICD-10-CM | POA: Diagnosis not present

## 2022-06-02 DIAGNOSIS — R4701 Aphasia: Secondary | ICD-10-CM | POA: Diagnosis present

## 2022-06-02 DIAGNOSIS — Z820 Family history of epilepsy and other diseases of the nervous system: Secondary | ICD-10-CM | POA: Diagnosis not present

## 2022-06-02 DIAGNOSIS — D496 Neoplasm of unspecified behavior of brain: Principal | ICD-10-CM

## 2022-06-02 DIAGNOSIS — G936 Cerebral edema: Secondary | ICD-10-CM | POA: Diagnosis not present

## 2022-06-02 DIAGNOSIS — J341 Cyst and mucocele of nose and nasal sinus: Secondary | ICD-10-CM | POA: Diagnosis not present

## 2022-06-02 DIAGNOSIS — Z801 Family history of malignant neoplasm of trachea, bronchus and lung: Secondary | ICD-10-CM

## 2022-06-02 DIAGNOSIS — D320 Benign neoplasm of cerebral meninges: Principal | ICD-10-CM | POA: Diagnosis present

## 2022-06-02 DIAGNOSIS — D329 Benign neoplasm of meninges, unspecified: Secondary | ICD-10-CM | POA: Diagnosis not present

## 2022-06-02 DIAGNOSIS — Z8619 Personal history of other infectious and parasitic diseases: Secondary | ICD-10-CM

## 2022-06-02 DIAGNOSIS — Z8711 Personal history of peptic ulcer disease: Secondary | ICD-10-CM | POA: Diagnosis not present

## 2022-06-02 DIAGNOSIS — I611 Nontraumatic intracerebral hemorrhage in hemisphere, cortical: Secondary | ICD-10-CM | POA: Diagnosis not present

## 2022-06-02 HISTORY — PX: CRANIOTOMY: SHX93

## 2022-06-02 HISTORY — PX: APPLICATION OF CRANIAL NAVIGATION: SHX6578

## 2022-06-02 LAB — CBC
HCT: 38.6 % (ref 36.0–46.0)
Hemoglobin: 12.5 g/dL (ref 12.0–15.0)
MCH: 29.8 pg (ref 26.0–34.0)
MCHC: 32.4 g/dL (ref 30.0–36.0)
MCV: 92.1 fL (ref 80.0–100.0)
Platelets: 177 10*3/uL (ref 150–400)
RBC: 4.19 MIL/uL (ref 3.87–5.11)
RDW: 13 % (ref 11.5–15.5)
WBC: 4.3 10*3/uL (ref 4.0–10.5)
nRBC: 0 % (ref 0.0–0.2)

## 2022-06-02 LAB — POCT I-STAT 7, (LYTES, BLD GAS, ICA,H+H)
Acid-Base Excess: 0 mmol/L (ref 0.0–2.0)
Bicarbonate: 24.1 mmol/L (ref 20.0–28.0)
Calcium, Ion: 1.17 mmol/L (ref 1.15–1.40)
HCT: 33 % — ABNORMAL LOW (ref 36.0–46.0)
Hemoglobin: 11.2 g/dL — ABNORMAL LOW (ref 12.0–15.0)
O2 Saturation: 100 %
Potassium: 3.6 mmol/L (ref 3.5–5.1)
Sodium: 139 mmol/L (ref 135–145)
TCO2: 25 mmol/L (ref 22–32)
pCO2 arterial: 35.3 mmHg (ref 32–48)
pH, Arterial: 7.442 (ref 7.35–7.45)
pO2, Arterial: 320 mmHg — ABNORMAL HIGH (ref 83–108)

## 2022-06-02 LAB — BASIC METABOLIC PANEL
Anion gap: 8 (ref 5–15)
BUN: 16 mg/dL (ref 8–23)
CO2: 25 mmol/L (ref 22–32)
Calcium: 8.9 mg/dL (ref 8.9–10.3)
Chloride: 105 mmol/L (ref 98–111)
Creatinine, Ser: 0.79 mg/dL (ref 0.44–1.00)
GFR, Estimated: >60 mL/min (ref >60)
Glucose, Bld: 105 mg/dL — ABNORMAL HIGH (ref 70–99)
Potassium: 3.8 mmol/L (ref 3.5–5.1)
Sodium: 138 mmol/L (ref 135–145)

## 2022-06-02 LAB — ABO/RH: ABO/RH(D): A NEG

## 2022-06-02 SURGERY — CRANIOTOMY TUMOR EXCISION
Anesthesia: General

## 2022-06-02 MED ORDER — DEXAMETHASONE SODIUM PHOSPHATE 10 MG/ML IJ SOLN
INTRAMUSCULAR | Status: DC | PRN
  Administered 2022-06-02: 10 mg via INTRAVENOUS

## 2022-06-02 MED ORDER — POLYETHYLENE GLYCOL 3350 17 G PO PACK: 17.0000 g | PACK | Freq: Every day | ORAL | Status: DC | PRN

## 2022-06-02 MED ORDER — LIDOCAINE-EPINEPHRINE 1 %-1:100000 IJ SOLN
INTRAMUSCULAR | Status: AC
  Filled 2022-06-02: qty 1

## 2022-06-02 MED ORDER — PHENYLEPHRINE HCL-NACL 20-0.9 MG/250ML-% IV SOLN
INTRAVENOUS | Status: DC | PRN
  Administered 2022-06-02: 40 ug/min via INTRAVENOUS

## 2022-06-02 MED ORDER — EPHEDRINE 5 MG/ML INJ
INTRAVENOUS | Status: AC
  Filled 2022-06-02: qty 5

## 2022-06-02 MED ORDER — PHENYLEPHRINE 80 MCG/ML (10ML) SYRINGE FOR IV PUSH (FOR BLOOD PRESSURE SUPPORT)
PREFILLED_SYRINGE | INTRAVENOUS | Status: DC | PRN
  Administered 2022-06-02: 80 ug via INTRAVENOUS

## 2022-06-02 MED ORDER — VANCOMYCIN HCL IN DEXTROSE 1-5 GM/200ML-% IV SOLN
1000.0000 mg | INTRAVENOUS | Status: AC
  Administered 2022-06-02: 1000 mg via INTRAVENOUS
  Filled 2022-06-02: qty 200

## 2022-06-02 MED ORDER — CHLORHEXIDINE GLUCONATE 0.12 % MT SOLN
15.0000 mL | Freq: Once | OROMUCOSAL | Status: AC
  Administered 2022-06-02: 15 mL via OROMUCOSAL
  Filled 2022-06-02: qty 15

## 2022-06-02 MED ORDER — AMISULPRIDE (ANTIEMETIC) 5 MG/2ML IV SOLN: 10.0000 mg | Freq: Once | INTRAVENOUS | Status: DC | PRN

## 2022-06-02 MED ORDER — ACETAMINOPHEN 500 MG PO TABS
1000.0000 mg | ORAL_TABLET | Freq: Once | ORAL | Status: AC
  Administered 2022-06-02: 1000 mg via ORAL
  Filled 2022-06-02: qty 2

## 2022-06-02 MED ORDER — SUCCINYLCHOLINE CHLORIDE 200 MG/10ML IV SOSY
PREFILLED_SYRINGE | INTRAVENOUS | Status: AC
  Filled 2022-06-02: qty 10

## 2022-06-02 MED ORDER — PROMETHAZINE HCL 25 MG PO TABS: 12.5000 mg | ORAL_TABLET | ORAL | Status: DC | PRN

## 2022-06-02 MED ORDER — PROPOFOL 10 MG/ML IV BOLUS
INTRAVENOUS | Status: AC
  Filled 2022-06-02: qty 20

## 2022-06-02 MED ORDER — EPHEDRINE SULFATE-NACL 50-0.9 MG/10ML-% IV SOSY
PREFILLED_SYRINGE | INTRAVENOUS | Status: DC | PRN
  Administered 2022-06-02 (×2): 10 mg via INTRAVENOUS
  Administered 2022-06-02: 5 mg via INTRAVENOUS

## 2022-06-02 MED ORDER — ROCURONIUM BROMIDE 10 MG/ML (PF) SYRINGE
PREFILLED_SYRINGE | INTRAVENOUS | Status: AC
  Filled 2022-06-02: qty 10

## 2022-06-02 MED ORDER — CLEVIDIPINE BUTYRATE 0.5 MG/ML IV EMUL
INTRAVENOUS | Status: DC | PRN
  Administered 2022-06-02: 5 mg/h via INTRAVENOUS

## 2022-06-02 MED ORDER — BACITRACIN ZINC 500 UNIT/GM EX OINT
TOPICAL_OINTMENT | CUTANEOUS | Status: AC
  Filled 2022-06-02: qty 28.35

## 2022-06-02 MED ORDER — GADOBUTROL 1 MMOL/ML IV SOLN
6.5000 mL | Freq: Once | INTRAVENOUS | Status: AC | PRN
  Administered 2022-06-02: 6.5 mL via INTRAVENOUS

## 2022-06-02 MED ORDER — DOCUSATE SODIUM 100 MG PO CAPS
100.0000 mg | ORAL_CAPSULE | Freq: Two times a day (BID) | ORAL | Status: DC
  Administered 2022-06-02 – 2022-06-04 (×4): 100 mg via ORAL
  Filled 2022-06-02 (×5): qty 1

## 2022-06-02 MED ORDER — 0.9 % SODIUM CHLORIDE (POUR BTL) OPTIME
TOPICAL | Status: DC | PRN
  Administered 2022-06-02: 1000 mL

## 2022-06-02 MED ORDER — ONDANSETRON HCL 4 MG/2ML IJ SOLN
4.0000 mg | INTRAMUSCULAR | Status: DC | PRN
  Filled 2022-06-02: qty 2

## 2022-06-02 MED ORDER — HYDROCODONE-ACETAMINOPHEN 5-325 MG PO TABS
1.0000 | ORAL_TABLET | ORAL | Status: DC | PRN
  Administered 2022-06-02 (×2): 1 via ORAL
  Filled 2022-06-02 (×2): qty 1

## 2022-06-02 MED ORDER — LIDOCAINE 2% (20 MG/ML) 5 ML SYRINGE
INTRAMUSCULAR | Status: AC
  Filled 2022-06-02: qty 5

## 2022-06-02 MED ORDER — VANCOMYCIN HCL 1250 MG/250ML IV SOLN
1250.0000 mg | INTRAVENOUS | Status: DC
  Administered 2022-06-03: 1250 mg via INTRAVENOUS
  Filled 2022-06-02: qty 250

## 2022-06-02 MED ORDER — LIDOCAINE-EPINEPHRINE 1 %-1:100000 IJ SOLN
INTRAMUSCULAR | Status: DC | PRN
  Administered 2022-06-02: 10 mL

## 2022-06-02 MED ORDER — CHLORHEXIDINE GLUCONATE CLOTH 2 % EX PADS: 6.0000 | MEDICATED_PAD | Freq: Once | CUTANEOUS | Status: DC

## 2022-06-02 MED ORDER — BACITRACIN ZINC 500 UNIT/GM EX OINT
TOPICAL_OINTMENT | CUTANEOUS | Status: DC | PRN
  Administered 2022-06-02: 1 via TOPICAL

## 2022-06-02 MED ORDER — LIDOCAINE 2% (20 MG/ML) 5 ML SYRINGE
INTRAMUSCULAR | Status: DC | PRN
  Administered 2022-06-02: 100 mg via INTRAVENOUS

## 2022-06-02 MED ORDER — SUGAMMADEX SODIUM 200 MG/2ML IV SOLN
INTRAVENOUS | Status: DC | PRN
  Administered 2022-06-02: 100 mg via INTRAVENOUS
  Administered 2022-06-02: 200 mg via INTRAVENOUS

## 2022-06-02 MED ORDER — HYDROMORPHONE HCL 1 MG/ML IJ SOLN
0.5000 mg | INTRAMUSCULAR | Status: DC | PRN
  Administered 2022-06-02: 0.5 mg via INTRAVENOUS
  Filled 2022-06-02 (×2): qty 1

## 2022-06-02 MED ORDER — PROPOFOL 500 MG/50ML IV EMUL
INTRAVENOUS | Status: DC | PRN
  Administered 2022-06-02: 100 ug/kg/min via INTRAVENOUS

## 2022-06-02 MED ORDER — SODIUM CHLORIDE 0.9 % IV SOLN
0.1500 ug/kg/min | INTRAVENOUS | Status: DC
  Administered 2022-06-02: .05 ug/kg/min via INTRAVENOUS
  Filled 2022-06-02: qty 2000

## 2022-06-02 MED ORDER — OXYCODONE HCL 5 MG PO TABS: 5.0000 mg | ORAL_TABLET | Freq: Once | ORAL | Status: DC | PRN

## 2022-06-02 MED ORDER — CHLORHEXIDINE GLUCONATE CLOTH 2 % EX PADS
6.0000 | MEDICATED_PAD | Freq: Every day | CUTANEOUS | Status: DC
  Administered 2022-06-02: 6 via TOPICAL

## 2022-06-02 MED ORDER — DEXAMETHASONE SODIUM PHOSPHATE 10 MG/ML IJ SOLN
INTRAMUSCULAR | Status: AC
  Filled 2022-06-02: qty 1

## 2022-06-02 MED ORDER — ONDANSETRON HCL 4 MG PO TABS: 4.0000 mg | ORAL_TABLET | ORAL | Status: DC | PRN

## 2022-06-02 MED ORDER — PHENYLEPHRINE 80 MCG/ML (10ML) SYRINGE FOR IV PUSH (FOR BLOOD PRESSURE SUPPORT)
PREFILLED_SYRINGE | INTRAVENOUS | Status: AC
  Filled 2022-06-02: qty 10

## 2022-06-02 MED ORDER — FENTANYL CITRATE (PF) 250 MCG/5ML IJ SOLN
INTRAMUSCULAR | Status: DC | PRN
  Administered 2022-06-02: 100 ug via INTRAVENOUS
  Administered 2022-06-02: 50 ug via INTRAVENOUS

## 2022-06-02 MED ORDER — THROMBIN 5000 UNITS EX SOLR
OROMUCOSAL | Status: DC | PRN
  Administered 2022-06-02: 5 mL via TOPICAL

## 2022-06-02 MED ORDER — THROMBIN 20000 UNITS EX KIT
PACK | CUTANEOUS | Status: DC | PRN
  Administered 2022-06-02 (×2): 20 mL via TOPICAL

## 2022-06-02 MED ORDER — SODIUM CHLORIDE 0.9 % IV SOLN: INTRAVENOUS | Status: DC | PRN

## 2022-06-02 MED ORDER — ACETAMINOPHEN 650 MG RE SUPP: 650.0000 mg | RECTAL | Status: DC | PRN

## 2022-06-02 MED ORDER — LABETALOL HCL 5 MG/ML IV SOLN: 10.0000 mg | INTRAVENOUS | Status: DC | PRN

## 2022-06-02 MED ORDER — THROMBIN (RECOMBINANT) 5000 UNITS EX SOLR
CUTANEOUS | Status: AC
  Filled 2022-06-02: qty 5000

## 2022-06-02 MED ORDER — LACTATED RINGERS IV SOLN: INTRAVENOUS | Status: DC

## 2022-06-02 MED ORDER — HEPARIN SODIUM (PORCINE) 5000 UNIT/ML IJ SOLN
5000.0000 [IU] | Freq: Three times a day (TID) | INTRAMUSCULAR | Status: DC
  Administered 2022-06-04: 5000 [IU] via SUBCUTANEOUS
  Filled 2022-06-02: qty 1

## 2022-06-02 MED ORDER — ONDANSETRON HCL 4 MG/2ML IJ SOLN: 4.0000 mg | Freq: Once | INTRAMUSCULAR | Status: DC | PRN

## 2022-06-02 MED ORDER — THROMBIN 5000 UNITS EX SOLR
CUTANEOUS | Status: AC
  Filled 2022-06-02: qty 5000

## 2022-06-02 MED ORDER — ORAL CARE MOUTH RINSE: 15.0000 mL | Freq: Once | OROMUCOSAL | Status: AC

## 2022-06-02 MED ORDER — ACETAMINOPHEN 325 MG PO TABS
650.0000 mg | ORAL_TABLET | ORAL | Status: DC | PRN
  Administered 2022-06-02 – 2022-06-04 (×6): 650 mg via ORAL
  Filled 2022-06-02 (×7): qty 2

## 2022-06-02 MED ORDER — PROPOFOL 10 MG/ML IV BOLUS
INTRAVENOUS | Status: DC | PRN
  Administered 2022-06-02: 130 mg via INTRAVENOUS
  Administered 2022-06-02: 50 mg via INTRAVENOUS

## 2022-06-02 MED ORDER — THROMBIN 20000 UNITS EX SOLR
CUTANEOUS | Status: AC
  Filled 2022-06-02: qty 20000

## 2022-06-02 MED ORDER — ONDANSETRON HCL 4 MG/2ML IJ SOLN
INTRAMUSCULAR | Status: AC
  Filled 2022-06-02: qty 2

## 2022-06-02 MED ORDER — FENTANYL CITRATE (PF) 250 MCG/5ML IJ SOLN
INTRAMUSCULAR | Status: AC
  Filled 2022-06-02: qty 5

## 2022-06-02 MED ORDER — OXYCODONE HCL 5 MG/5ML PO SOLN: 5.0000 mg | Freq: Once | ORAL | Status: DC | PRN

## 2022-06-02 MED ORDER — ONDANSETRON HCL 4 MG/2ML IJ SOLN
INTRAMUSCULAR | Status: DC | PRN
  Administered 2022-06-02: 4 mg via INTRAVENOUS

## 2022-06-02 MED ORDER — THROMBIN (RECOMBINANT) 20000 UNITS EX SOLR
CUTANEOUS | Status: AC
  Filled 2022-06-02: qty 20000

## 2022-06-02 MED ORDER — ROCURONIUM BROMIDE 10 MG/ML (PF) SYRINGE
PREFILLED_SYRINGE | INTRAVENOUS | Status: DC | PRN
  Administered 2022-06-02: 20 mg via INTRAVENOUS
  Administered 2022-06-02: 70 mg via INTRAVENOUS
  Administered 2022-06-02: 10 mg via INTRAVENOUS
  Administered 2022-06-02: 20 mg via INTRAVENOUS

## 2022-06-02 MED ORDER — FENTANYL CITRATE (PF) 100 MCG/2ML IJ SOLN: 25.0000 ug | INTRAMUSCULAR | Status: DC | PRN

## 2022-06-02 SURGICAL SUPPLY — 98 items
APL SKNCLS STERI-STRIP NONHPOA (GAUZE/BANDAGES/DRESSINGS)
BAG COUNTER SPONGE SURGICOUNT (BAG) ×2 IMPLANT
BAG SPNG CNTER NS LX DISP (BAG) ×2
BAND INSRT 18 STRL LF DISP RB (MISCELLANEOUS)
BAND RUBBER #18 3X1/16 STRL (MISCELLANEOUS) IMPLANT
BENZOIN TINCTURE PRP APPL 2/3 (GAUZE/BANDAGES/DRESSINGS) IMPLANT
BLADE CLIPPER SURG (BLADE) ×2 IMPLANT
BLADE SAW GIGLI 16 STRL (MISCELLANEOUS) IMPLANT
BLADE SURG 15 STRL LF DISP TIS (BLADE) IMPLANT
BLADE SURG 15 STRL SS (BLADE) ×2
BNDG CMPR 75X41 PLY HI ABS (GAUZE/BANDAGES/DRESSINGS)
BNDG GAUZE DERMACEA FLUFF 4 (GAUZE/BANDAGES/DRESSINGS) IMPLANT
BNDG GZE DERMACEA 4 6PLY (GAUZE/BANDAGES/DRESSINGS)
BNDG STRETCH 4X75 STRL LF (GAUZE/BANDAGES/DRESSINGS) IMPLANT
BUR ACORN 9.0 PRECISION (BURR) ×2 IMPLANT
BUR ROUND PRECISION 4.0 (BURR) IMPLANT
BUR SPIRAL ROUTER 2.3 (BUR) ×2 IMPLANT
CANISTER SUCT 3000ML PPV (MISCELLANEOUS) ×4 IMPLANT
CASSETTE SUCT IRRIG SONOPET IQ (MISCELLANEOUS) IMPLANT
CATH VENTRIC 35X38 W/TROCAR LG (CATHETERS) IMPLANT
CLIP VESOCCLUDE MED 6/CT (CLIP) IMPLANT
CNTNR URN SCR LID CUP LEK RST (MISCELLANEOUS) ×2 IMPLANT
CONT SPEC 4OZ STRL OR WHT (MISCELLANEOUS) ×2
COVER BURR HOLE 7 (Orthopedic Implant) IMPLANT
COVER MAYO STAND STRL (DRAPES) IMPLANT
COVERAGE SUPPORT O-ARM STEALTH (MISCELLANEOUS) ×2 IMPLANT
DRAIN SUBARACHNOID (WOUND CARE) IMPLANT
DRAPE HALF SHEET 40X57 (DRAPES) ×2 IMPLANT
DRAPE MICROSCOPE SLANT 54X150 (MISCELLANEOUS) IMPLANT
DRAPE NEUROLOGICAL W/INCISE (DRAPES) ×2 IMPLANT
DRAPE STERI IOBAN 125X83 (DRAPES) IMPLANT
DRAPE SURG 17X23 STRL (DRAPES) IMPLANT
DRAPE WARM FLUID 44X44 (DRAPES) ×2 IMPLANT
DRSG ADAPTIC 3X8 NADH LF (GAUZE/BANDAGES/DRESSINGS) IMPLANT
DRSG TELFA 3X8 NADH STRL (GAUZE/BANDAGES/DRESSINGS) IMPLANT
DURAPREP 6ML APPLICATOR 50/CS (WOUND CARE) ×2 IMPLANT
ELECT REM PT RETURN 9FT ADLT (ELECTROSURGICAL) ×2
ELECTRODE REM PT RTRN 9FT ADLT (ELECTROSURGICAL) ×2 IMPLANT
EVACUATOR 1/8 PVC DRAIN (DRAIN) IMPLANT
EVACUATOR SILICONE 100CC (DRAIN) IMPLANT
FEE COVERAGE SUPPORT O-ARM (MISCELLANEOUS) ×2 IMPLANT
FORCEPS BIPOLAR SPETZLER 8 1.0 (NEUROSURGERY SUPPLIES) ×2 IMPLANT
GAUZE 4X4 16PLY ~~LOC~~+RFID DBL (SPONGE) IMPLANT
GAUZE SPONGE 4X4 12PLY STRL (GAUZE/BANDAGES/DRESSINGS) IMPLANT
GLOVE BIO SURGEON STRL SZ7 (GLOVE) IMPLANT
GLOVE BIOGEL PI IND STRL 7.0 (GLOVE) IMPLANT
GLOVE BIOGEL PI IND STRL 7.5 (GLOVE) ×2 IMPLANT
GLOVE ECLIPSE 7.5 STRL STRAW (GLOVE) ×2 IMPLANT
GLOVE EXAM NITRILE LRG STRL (GLOVE) IMPLANT
GLOVE EXAM NITRILE XS STR PU (GLOVE) IMPLANT
GOWN STRL REUS W/ TWL LRG LVL3 (GOWN DISPOSABLE) ×4 IMPLANT
GOWN STRL REUS W/ TWL XL LVL3 (GOWN DISPOSABLE) IMPLANT
GOWN STRL REUS W/TWL 2XL LVL3 (GOWN DISPOSABLE) IMPLANT
GOWN STRL REUS W/TWL LRG LVL3 (GOWN DISPOSABLE) ×4
GOWN STRL REUS W/TWL XL LVL3 (GOWN DISPOSABLE)
GRAFT DURAGEN MATRIX 3WX3L (Graft) ×2 IMPLANT
GRAFT DURAGEN MATRIX 3X3 SNGL (Graft) IMPLANT
HEMOSTAT POWDER KIT SURGIFOAM (HEMOSTASIS) ×2 IMPLANT
HEMOSTAT SURGICEL 2X14 (HEMOSTASIS) ×2 IMPLANT
IV NS 1000ML (IV SOLUTION) ×2
IV NS 1000ML BAXH (IV SOLUTION) ×2 IMPLANT
KIT BASIN OR (CUSTOM PROCEDURE TRAY) ×2 IMPLANT
KIT DRAIN CSF ACCUDRAIN (MISCELLANEOUS) IMPLANT
KIT TURNOVER KIT B (KITS) ×2 IMPLANT
MARKER SPHERE PSV REFLC NDI (MISCELLANEOUS) ×6 IMPLANT
NDL SPNL 18GX3.5 QUINCKE PK (NEEDLE) IMPLANT
NEEDLE HYPO 22GX1.5 SAFETY (NEEDLE) ×2 IMPLANT
NEEDLE SPNL 18GX3.5 QUINCKE PK (NEEDLE) IMPLANT
NS IRRIG 1000ML POUR BTL (IV SOLUTION) ×6 IMPLANT
PACK CRANIOTOMY CUSTOM (CUSTOM PROCEDURE TRAY) ×2 IMPLANT
PATTIES SURGICAL .25X.25 (GAUZE/BANDAGES/DRESSINGS) IMPLANT
PATTIES SURGICAL .5 X.5 (GAUZE/BANDAGES/DRESSINGS) IMPLANT
PATTIES SURGICAL .5 X3 (DISPOSABLE) IMPLANT
PATTIES SURGICAL 1/4 X 3 (GAUZE/BANDAGES/DRESSINGS) IMPLANT
PATTIES SURGICAL 1X1 (DISPOSABLE) IMPLANT
PIN MAYFIELD SKULL DISP (PIN) ×2 IMPLANT
PLATE DOUBLE Y CMF 6H (Plate) IMPLANT
SCREW UNIII AXS SD 1.5X4 (Screw) IMPLANT
SPECIMEN JAR SMALL (MISCELLANEOUS) IMPLANT
SPIKE FLUID TRANSFER (MISCELLANEOUS) ×2 IMPLANT
SPONGE NEURO XRAY DETECT 1X3 (DISPOSABLE) IMPLANT
SPONGE SURGIFOAM ABS GEL 100 (HEMOSTASIS) ×2 IMPLANT
STAPLER VISISTAT 35W (STAPLE) ×2 IMPLANT
SUT ETHILON 3 0 FSL (SUTURE) IMPLANT
SUT ETHILON 3 0 PS 1 (SUTURE) IMPLANT
SUT MNCRL AB 3-0 PS2 18 (SUTURE) IMPLANT
SUT MON AB 3-0 SH 27 (SUTURE)
SUT MON AB 3-0 SH27 (SUTURE) IMPLANT
SUT NURALON 4 0 TR CR/8 (SUTURE) ×6 IMPLANT
SUT SILK 0 TIES 10X30 (SUTURE) IMPLANT
SUT VIC AB 2-0 CP2 18 (SUTURE) ×2 IMPLANT
TIP TISSUE SONOPET IQ STD 12 (TIP) IMPLANT
TOWEL GREEN STERILE (TOWEL DISPOSABLE) ×2 IMPLANT
TOWEL GREEN STERILE FF (TOWEL DISPOSABLE) ×2 IMPLANT
TRAY FOLEY MTR SLVR 16FR STAT (SET/KITS/TRAYS/PACK) ×2 IMPLANT
TUBE CONNECTING 12X1/4 (SUCTIONS) ×2 IMPLANT
UNDERPAD 30X36 HEAVY ABSORB (UNDERPADS AND DIAPERS) ×2 IMPLANT
WATER STERILE IRR 1000ML POUR (IV SOLUTION) ×2 IMPLANT

## 2022-06-02 NOTE — Progress Notes (Signed)
  Transition of Care [facility]) Screening Note   Patient Details  Name: Idania Desouza Date of Birth: 21-Mar-1946   Transition of Care [facility]) CM/SW Contact:    Benard Halsted, LCSW Phone Number: 06/02/2022, 3:29 PM    Transition of Care Department [facility] has reviewed patient. We will continue to monitor patient advancement through interdisciplinary progression rounds. If new patient transition needs arise, please place a TOC consult.

## 2022-06-02 NOTE — Anesthesia Procedure Notes (Signed)
Procedure Name: Intubation Date/Time: 06/02/2022 8:02 AM  Performed by: Erick Colace, CRNAPre-anesthesia Checklist: Patient identified, Emergency Drugs available, Suction available and Patient being monitored Patient Re-evaluated:Patient Re-evaluated prior to induction Oxygen Delivery Method: Circle system utilized Preoxygenation: Pre-oxygenation with 100% oxygen Induction Type: IV induction Ventilation: Mask ventilation without difficulty Laryngoscope Size: Mac and 3 Grade View: Grade I Tube type: Oral Tube size: 7.0 mm Number of attempts: 1 Airway Equipment and Method: Stylet and Oral airway Placement Confirmation: ETT inserted through vocal cords under direct vision, positive ETCO2 and breath sounds checked- equal and bilateral Tube secured with: Tape Dental Injury: Teeth and Oropharynx as per pre-operative assessment  Comments: SRNA K Venrick

## 2022-06-02 NOTE — Transfer of Care (Signed)
Immediate Anesthesia Transfer of Care Note  Patient: Annette Wilson  Procedure(s) Performed: Left craniotomy for tumor resection (Left) APPLICATION OF CRANIAL NAVIGATION  Patient Location: PACU  Anesthesia Type:General  Level of Consciousness: drowsy  Airway & Oxygen Therapy: Patient Spontanous Breathing and Patient connected to face mask oxygen  Post-op Assessment: Report given to RN, Post -op Vital signs reviewed and stable, and Patient moving all extremities X 4  Post vital signs: Reviewed and stable  Last Vitals:  Vitals Value Taken Time  BP 116/53 06/02/22 1113  Temp    Pulse 60 06/02/22 1114  Resp 15 06/02/22 1114  SpO2 100 % 06/02/22 1114  Vitals shown include unvalidated device data.  Last Pain:  Vitals:   06/02/22 0617  TempSrc:   PainSc: 0-No pain         Complications: No notable events documented.

## 2022-06-02 NOTE — Anesthesia Procedure Notes (Signed)
Arterial Line Insertion Start/End11/12/2021 7:00 AM, 06/02/2022 7:05 AM Performed by: CRNA  Patient location: Pre-op. Preanesthetic checklist: patient identified, IV checked, site marked, risks and benefits discussed, surgical consent, monitors and equipment checked, pre-op evaluation, timeout performed and anesthesia consent Lidocaine 1% used for infiltration Right, radial was placed Catheter size: 20 G Hand hygiene performed  and maximum sterile barriers used   Attempts: 3 Procedure performed without using ultrasound guided technique. Following insertion, dressing applied and Biopatch. Post procedure assessment: normal and unchanged  Patient tolerated the procedure well with no immediate complications. Additional procedure comments: 2 unsuccessful attempts by [facility], one successful attempt by CRNA.Annette Wilson

## 2022-06-02 NOTE — H&P (Signed)
Surgical H&P Update  HPI: 76 y.o. left handed woman with a history of paroxysmal episodes of aphasia. 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 No changes in health since they were last seen. Still having stable speech symptoms that are intermittent and wishes to proceed with surgery.  PMHx:  Past Medical History:  Diagnosis Date   Abscess 1954   developed abscesses after her tonsilectomy   Brain tumor ([place]) 09/2018   Duodenal ulcer 1961   Hepatitis 1951   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   FamHx:  Family History  Problem Relation Age of Onset   Alzheimer's disease Mother 69   Lung cancer Father 21   SocHx:  reports that she has never smoked. She has never used smokeless tobacco. She reports that she does not currently use alcohol. She reports that she does not use drugs.  Physical Exam: Awake/alert, oriented x3, PERRL, EOMI, FS & SS, TM, strength 5/5x4, SILTx4, speech fluent with normal content  Assesment/Plan: 76 y.o. woman with left frontal suspected meningioma, here for left craniotomy and tumor resection. Risks, benefits, and alternatives discussed and the patient would like to continue with surgery.  -OR today -4N ICU post-op  Judith Part, MD 06/02/22 7:39 AM

## 2022-06-02 NOTE — Progress Notes (Signed)
Pharmacy Antibiotic Note  Annette Wilson is a 76 y.o. female admitted on 06/02/2022 with surgical prophylaxis.  Pharmacy has been consulted for vancomycin dosing. Vancomycin 1000 mg IV x1 given pre-op 06/02/22 @ 0638   Plan: Vancomycin 1250 mg IV q 24h to begin 11/7  F/u renal func, clinical picture F/u LOT  Height: '5\' 4"'$  (162.6 cm) Weight: 64.9 kg (143 lb) IBW/kg (Calculated) : 54.7  Temp (24hrs), Avg:98 F (36.7 C), Min:97.9 F (36.6 C), Max:98.3 F (36.8 C)  Recent Labs  Lab 06/02/22 0657 06/02/22 1302  WBC  --  4.3  CREATININE 0.79  --     Estimated Creatinine Clearance: 51.7 mL/min (by C-G formula based on SCr of 0.79 mg/dL).    Allergies  Allergen Reactions   Alpha-Gal Anaphylaxis and Hives   Aspirin Anaphylaxis    REACTION: throat swelling   Cefadroxil Anaphylaxis   Penicillins     Unknown reaction, childhood allergy    Antimicrobials this admission: Vanc 11/6>  Dose adjustments this admission:   Microbiology results:  Thank you for allowing pharmacy to be a part of this patient's care.   Wilson Singer, PharmD Clinical Pharmacist 06/02/2022 1:42 PM

## 2022-06-02 NOTE — Op Note (Signed)
PATIENT: Annette Wilson  DAY OF SURGERY: 06/02/22   PRE-OPERATIVE DIAGNOSIS:  Left brain tumor   POST-OPERATIVE DIAGNOSIS:  Same   PROCEDURE:  Left craniotomy for resection of extra-axial brain tumor, use of frameless stereotactic navigation, use of operating room microscope   SURGEON:  Surgeon(s) and Role:    Judith Part, MD - Primary    Kristeen Miss, MD - Assisting   ANESTHESIA: ETGA   BRIEF HISTORY: This is a 76 year old woman who presented with intermittent speech and language issues. Of note, she works as a Insurance underwriter. Workup showed a left frontal extra-axial mass, which enlarged on subsequent imaging. I recommended surgical resection. This was discussed with the patient as well as risks, benefits, and alternatives and wished to proceed with surgery.   OPERATIVE DETAIL: The patient was taken to the operating room and placed on the OR table in the supine position. A formal time out was performed with two patient identifiers and confirmed the operative site. Anesthesia was induced by the anesthesia team. The [place] head holder was applied to the head and a registration array was attached to the [place]. This was co-registered with the patient's preoperative imaging, the fit appeared to be acceptable. Using frameless stereotaxy, the operative trajectory was planned and the incision was marked. Hair was clipped with surgical clippers over the incision and the area was then prepped and draped in a sterile fashion.  A linear incision was placed on the left scalp in the usual location for a pterional craniotomy. Soft tissues were dissected, a bone flap was created, and the dura was opened. The tumor was immediately evident. A speciment was taken for pathology and the tumor was internally debulked. As expected, there was invasive with tumor cells visible along the pia and arachnoid. I tried to find a plane at multiple locations but the dense adherence of the cortex to this  pia-cortex interface everywhere. Given the patient's age and the tumor's location, I thought it was best to leave that layer intact. I then completed the resection of the involved dura and cauterized the dura-skull interface where the involved dura was removed. The large posterior vein of [place] was left intact at the posterior-inferior corner.   Hemostasis was obtained and confirmed, the wound was copiously irrigated, the dura was closed with suture, the bone flap was replaced and secured with titanium plates and screws.   All instrument and sponge counts were correct, the incision was then closed in layers. The patient was then returned to anesthesia for emergence. No apparent complications at the completion of the procedure.   EBL:  29m   DRAINS: none   SPECIMENS: Left brain tumor   TJudith Part MD 06/02/22 7:46 AM

## 2022-06-02 NOTE — Anesthesia Postprocedure Evaluation (Signed)
Anesthesia Post Note  Patient: Higher education careers adviser  Procedure(s) Performed: Left craniotomy for tumor resection (Left) APPLICATION OF CRANIAL NAVIGATION     Patient location during evaluation: PACU Anesthesia Type: General Level of consciousness: awake and alert Pain management: pain level controlled Vital Signs Assessment: post-procedure vital signs reviewed and stable Respiratory status: spontaneous breathing, nonlabored ventilation and respiratory function stable Cardiovascular status: blood pressure returned to baseline and stable Postop Assessment: no apparent nausea or vomiting Anesthetic complications: no   No notable events documented.  Last Vitals:  Vitals:   06/02/22 1400 06/02/22 1500  BP: (!) 118/53 (!) 120/51  Pulse: (!) 50 (!) 49  Resp: 11 16  Temp:    SpO2: 93% 93%    Last Pain:  Vitals:   06/02/22 1300  TempSrc:   PainSc: [place]

## 2022-06-03 MED ORDER — VANCOMYCIN HCL 1250 MG/250ML IV SOLN
1250.0000 mg | INTRAVENOUS | Status: DC
  Administered 2022-06-03: 1250 mg via INTRAVENOUS
  Filled 2022-06-03 (×2): qty 250

## 2022-06-03 MED ORDER — VANCOMYCIN HCL 1250 MG/250ML IV SOLN: 1250.0000 mg | INTRAVENOUS | Status: DC

## 2022-06-03 MED FILL — Thrombin (Recombinant) For Soln 5000 Unit: CUTANEOUS | Qty: 5000 | Status: AC

## 2022-06-03 MED FILL — Thrombin (Recombinant) For Soln 20000 Unit: CUTANEOUS | Qty: 1 | Status: AC

## 2022-06-03 NOTE — Progress Notes (Signed)
OT Cancellation Note  Patient Details Name: Genowefa Morga MRN: [id number] DOB: 02-Dec-1945   Cancelled Treatment:    Reason Eval/Treat Not Completed: Other (comment) (Pt being transferred to another room. Will check back later time.)  [facility] 06/03/2022, 2:36 PM Maurie Boettcher, OT/L   Acute OT Clinical Specialist Acute Rehabilitation Services Pager [phone] Office [phone]

## 2022-06-03 NOTE — Progress Notes (Signed)
Neurosurgery Service Progress Note  Subjective: No acute events overnight, no issues with speech, if anything improved from preop   Objective: Vitals:   06/03/22 0700 06/03/22 0800 06/03/22 0900 06/03/22 1000  BP: (!) 122/57 (!) 126/57 120/60 126/62  Pulse: (!) 56 60    Resp: 18 (!) '22 18 16  '$ Temp:  98.5 F (36.9 C)    TempSrc:  Oral    SpO2: 94% 96%    Weight:      Height:        Physical Exam: Aox3, PERRL, EOMI, FS TM strength 5/5x4, no drift, speech fluent with normal content  Assessment & Plan: 76 y.o. woman s/p resection L frontal meningioma, recovering well. Post-op MRI with small residual.  -transfer out of unit -d/c a-line and foley -SCDs/TEDs, SQH POD2  Annette Wilson  06/03/22 10:32 AM

## 2022-06-03 NOTE — Progress Notes (Signed)
Pt transferred from 4NICU to 4NP07. Pt alert and orientedx4, no complaints at this time. Husband and daughter at bedside.   Justice Rocher, RN

## 2022-06-03 NOTE — Evaluation (Signed)
Physical Therapy Evaluation Patient Details Name: Annette Wilson MRN: [id number] DOB: 1946-04-05 Today's Date: 06/03/2022  History of Present Illness  76 y.o. left handed woman with a history of paroxysmal episodes of aphasia. 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  She is now s/p craniotomy for L extra axial brain tumor.  PMH: remote history of hepatitis, scarlet fever, and duodenal ulcer.  Clinical Impression  Patient presents with decreased mobility due to deficits listed in PT problem list.  Currently needing min a for in room mobility with RW.  Previously independent coordinating her home for spouse and her daughter spouse assisting with meal preparation.  She will continue to benefit from skilled PT in the acute setting and would benefit from follow up outpatient PT if not progressing well on her own.        Recommendations for follow up therapy are one component of a multi-disciplinary discharge planning process, led by the attending physician.  Recommendations may be updated based on patient status, additional functional criteria and insurance authorization.  Follow Up Recommendations Outpatient PT (pt likely to refuse)      Assistance Recommended at Discharge Intermittent Supervision/Assistance  Patient can return home with the following  A little help with walking and/or transfers;Assist for transportation;Help with stairs or ramp for entrance;A little help with bathing/dressing/bathroom;Assistance with cooking/housework    Equipment Recommendations None recommended by PT  Recommendations for Other Services       Functional Status Assessment Patient has had a recent decline in their functional status and demonstrates the ability to make significant improvements in function in a reasonable and predictable amount of time.     Precautions / Restrictions Precautions Precautions: Fall      Mobility  Bed Mobility Overal bed mobility: Needs  Assistance Bed Mobility: Supine to Sit     Supine to sit: Min assist     General bed mobility comments: pulled up with PT assist    Transfers Overall transfer level: Needs assistance Equipment used: Rolling walker (2 wheels) Transfers: Sit to/from Stand Sit to Stand: Min assist           General transfer comment: assist for balance    Ambulation/Gait Ambulation/Gait assistance: Min assist Gait Distance (Feet): 12 Feet (x 2) Assistive device: Rolling walker (2 wheels) Gait Pattern/deviations: Step-through pattern, Decreased stride length, Wide base of support       General Gait Details: some instability noted with ambulating to bathroom then to recliner with min A for balance, cues for forward gaze to help with dizziness  Stairs            Wheelchair Mobility    Modified Rankin (Stroke Patients Only)       Balance Overall balance assessment: Needs assistance Sitting-balance support: Feet supported Sitting balance-Leahy Scale: Fair     Standing balance support: Reliant on assistive device for balance, Bilateral upper extremity supported Standing balance-Leahy Scale: Poor Standing balance comment: UE support for balance                             Pertinent Vitals/Pain Pain Assessment Pain Assessment: 0-10 Pain Score: 6  Pain Location: headache, neck sore from positioning in surgery and jaw sore from tube Pain Descriptors / Indicators: Headache, Sore Pain Intervention(s): Monitored during session    Home Living Family/patient expects to be discharged to:: Private residence Living Arrangements: Spouse/significant other;Children Available Help at Discharge: Family Type of Home:  House Home Access: Stairs to enter;Level entry   [employer] of Steps: level from carport in back, 2 steps no rail in front (uses to get the mail)   Home Layout: Two level;Full bath on main level;Able to live on main level with bedroom/bathroom Home  Equipment: Shower seat;BSC/3in1;Hand held shower head;Grab bars - tub/shower;Rolling Walker (2 wheels);Cane - single point      Prior Function Prior Level of Function : Independent/Modified Independent;Driving             Mobility Comments: has had aphasia for 4 years (intermittent) so does not drive far ADLs Comments: spouse cooks, she does other IADL's; daughter with impairments so cannot physically help     Hand Dominance   Dominant Hand: Left    Extremity/Trunk Assessment   Upper Extremity Assessment Upper Extremity Assessment: Generalized weakness    Lower Extremity Assessment Lower Extremity Assessment: Generalized weakness    Cervical / Trunk Assessment Cervical / Trunk Assessment: Other exceptions Cervical / Trunk Exceptions: pain and stiffness in neck from positioning during surgery  Communication   Communication: Expressive difficulties (had intermittent aphasia pre-op, but so far no episodes)  Cognition Arousal/Alertness: Awake/alert Behavior During Therapy: WFL for tasks assessed/performed Overall Cognitive Status: Within Functional Limits for tasks assessed                                          General Comments General comments (skin integrity, edema, etc.): A-line in R wrist with bruising and pt reports painful; craniotomy incision on L side parietal area; VSS throughout    Exercises     Assessment/Plan    PT Assessment Patient needs continued PT services  PT Problem List Decreased strength;Decreased balance;Pain;Decreased mobility;Decreased activity tolerance;Decreased knowledge of use of DME       PT Treatment Interventions DME instruction;Functional mobility training;Balance training;Patient/family education;Therapeutic activities;Gait training;Stair training;Therapeutic exercise    PT Goals (Current goals can be found in the Care Plan section)  Acute Rehab PT Goals Patient Stated Goal: to return home after day or two to  regain walking ability PT Goal Formulation: With patient Time For Goal Achievement: 06/17/22 Potential to Achieve Goals: Good    Frequency Min 3X/week     Co-evaluation               AM-PAC PT "6 Clicks" Mobility  Outcome Measure Help needed turning from your back to your side while in a flat bed without using bedrails?: A Little Help needed moving from lying on your back to sitting on the side of a flat bed without using bedrails?: A Little Help needed moving to and from a bed to a chair (including a wheelchair)?: A Little Help needed standing up from a chair using your arms (e.g., wheelchair or bedside chair)?: A Little Help needed to walk in hospital room?: Total Help needed climbing 3-5 steps with a railing? : Total 6 Click Score: 14    End of Session Equipment Utilized During Treatment: Gait belt Activity Tolerance: Patient limited by fatigue Patient left: in chair;with call bell/phone within reach;with chair alarm set   PT Visit Diagnosis: Other abnormalities of gait and mobility (R26.89);Muscle weakness (generalized) (M62.81)    Time: 1025-1100 PT Time Calculation (min) (ACUTE ONLY): 35 min   Charges:   PT Evaluation $PT Eval Moderate Complexity: 1 Mod PT Treatments $Gait Training: 8-22 mins  Magda Kiel, PT Acute Rehabilitation Services Office:[phone] 06/03/2022   Reginia Naas 06/03/2022, 1:59 PM

## 2022-06-03 NOTE — Evaluation (Signed)
Occupational Therapy Evaluation Patient Details Name: Annette Wilson MRN: [id number] DOB: May 06, 1946 Today's Date: 06/03/2022   History of Present Illness 76 y.o. left handed woman with a history of paroxysmal episodes of aphasia. 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  She is now s/p craniotomy for L extra axial brain tumor.  PMH: remote history of hepatitis, scarlet fever, and duodenal ulcer.   Clinical Impression   Pt currently min assist level for simulated selfcare sit to stand and for functional transfers without use of an assistive device.  Slight head and neck pain still limiting but overall doing well.  Feel she will benefit from acute care OT to help progress ADL function back to a modified independent level.  Anticipate no post acute OT follow-up or DME needs.        Recommendations for follow up therapy are one component of a multi-disciplinary discharge planning process, led by the attending physician.  Recommendations may be updated based on patient status, additional functional criteria and insurance authorization.   Follow Up Recommendations  No OT follow up    Assistance Recommended at Discharge PRN  Patient can return home with the following Assistance with cooking/housework;Assist for transportation;Help with stairs or ramp for entrance    Functional Status Assessment  Patient has had a recent decline in their functional status and demonstrates the ability to make significant improvements in function in a reasonable and predictable amount of time.  Equipment Recommendations  None recommended by OT       Precautions / Restrictions Precautions Precautions: Fall Restrictions Weight Bearing Restrictions: No      Mobility Bed Mobility Overal bed mobility: Needs Assistance Bed Mobility: Supine to Sit     Supine to sit: Supervision (to right side with use of rail)          Transfers Overall transfer level: Needs  assistance Equipment used: None Transfers: Sit to/from Stand Sit to Stand: Min assist           General transfer comment: Min assist progressing to min guard for mobility for ambulation to the bathroom.      Balance Overall balance assessment: Needs assistance Sitting-balance support: Feet supported Sitting balance-Leahy Scale: Good Sitting balance - Comments: Able to donn socks and cross LEs without LOB     Standing balance-Leahy Scale: Poor Standing balance comment: Needs UE support for balance                           ADL either performed or assessed with clinical judgement   ADL Overall ADL's : Needs assistance/impaired Eating/Feeding: Independent;Sitting   Grooming: Wash/dry hands;Wash/dry face;Oral care;Standing;Supervision/safety   Upper Body Bathing: Set up;Sitting Upper Body Bathing Details (indicate cue type and reason): simulated Lower Body Bathing: Set up;Sitting/lateral leans Lower Body Bathing Details (indicate cue type and reason): simulated Upper Body Dressing : Set up;Sitting Upper Body Dressing Details (indicate cue type and reason): simulated Lower Body Dressing: Supervision/safety;Sit to/from stand   Toilet Transfer: Minimal Teacher, English as a foreign language;Ambulation Toilet Transfer Details (indicate cue type and reason): no assistive device Toileting- Clothing Manipulation and Hygiene: Min guard;Sit to/from stand       Functional mobility during ADLs: Minimal assistance (No assistive device) General ADL Comments: Pt reports having a spouse and daughter at home that can assist some as needed, but she would do most of the work inside and outside of the house.  Reports slight wooziness with sitting and standing with  BP at 135/72 sitting EOB.     Vision Baseline Vision/History: 1 Wears glasses (for driving, not present currently) Ability to See in Adequate Light: 0 Adequate Patient Visual Report: No change from baseline Vision Assessment?: No  apparent visual deficits     Perception Perception Perception: Not tested   Praxis Praxis Praxis: Not tested    Pertinent Vitals/Pain Pain Assessment Pain Assessment: 0-10 Pain Score: 6  Pain Location: headache, neck sore from positioning in surgery Pain Descriptors / Indicators: Headache     Hand Dominance Left   Extremity/Trunk Assessment Upper Extremity Assessment Upper Extremity Assessment: Overall WFL for tasks assessed   Lower Extremity Assessment Lower Extremity Assessment: Defer to PT evaluation   Cervical / Trunk Assessment Cervical / Trunk Assessment: Other exceptions Cervical / Trunk Exceptions: She reports pain in her neck from surgery, heat pack in place   Communication Communication Communication: Expressive difficulties (had intermittent aphasia pre-op, but so far no episodes)   Cognition Arousal/Alertness: Awake/alert Behavior During Therapy: WFL for tasks assessed/performed Overall Cognitive Status: Within Functional Limits for tasks assessed                                       General Comments  A-line in R wrist with bruising and pt reports painful; craniotomy incision on L side parietal area; VSS throughout            Home Living Family/patient expects to be discharged to:: Private residence Living Arrangements: Spouse/significant other;Children Available Help at Discharge: Family Type of Home: House Home Access: Stairs to [employer] entry Entrance Stairs-Number of Steps: level from carport in back, 2 steps no rail in front (uses to get the mail)   Home Layout: Two level;Full bath on main level;Able to live on main level with bedroom/bathroom     Bathroom Shower/Tub: Occupational psychologist: Standard     Home Equipment: Shower seat;BSC/3in1;Hand held shower head;Grab bars - tub/shower;Rolling Environmental consultant (2 wheels);Cane - single point          Prior Functioning/Environment Prior Level of Function :  Independent/Modified Independent;Driving             Mobility Comments: has had aphasia for 4 years (intermittent) so does not drive far ADLs Comments: spouse cooks, she does other IADL's; daughter with impairments so cannot physically help        OT Problem List: Impaired balance (sitting and/or standing);Pain      OT Treatment/Interventions: Self-care/ADL training;Patient/family education;Balance training;Therapeutic activities;DME and/or AE instruction;Neuromuscular education    OT Goals(Current goals can be found in the care plan section) Acute Rehab OT Goals Patient Stated Goal: Pt did not state but agreeable to participation in OT eval. OT Goal Formulation: With patient Time For Goal Achievement: 06/17/22 Potential to Achieve Goals: Good  OT Frequency: Min 2X/week       AM-PAC OT "6 Clicks" Daily Activity     Outcome Measure Help from another person eating meals?: None Help from another person taking care of personal grooming?: A Little Help from another person toileting, which includes using toliet, bedpan, or urinal?: A Little Help from another person bathing (including washing, rinsing, drying)?: A Little Help from another person to put on and taking off regular upper body clothing?: None Help from another person to put on and taking off regular lower body clothing?: A Little 6 Click Score: 20   End of Session Equipment  Utilized During Treatment: Gait belt Nurse Communication: Mobility status  Activity Tolerance: Patient tolerated treatment well Patient left: in bed;with call bell/phone within reach  OT Visit Diagnosis: Unsteadiness on feet (R26.81)                Time: 4888-9169 OT Time Calculation (min): 38 min Charges:  OT General Charges $OT Visit: 1 Visit OT Evaluation $OT Eval Moderate Complexity: 1 Mod OT Treatments $Self Care/Home Management : 23-37 mins Chaslyn Eisen OTR/L 06/03/2022, 4:42 PM

## 2022-06-04 ENCOUNTER — Encounter (HOSPITAL_COMMUNITY): Payer: Self-pay | Admitting: Neurological Surgery

## 2022-06-04 LAB — SURGICAL PATHOLOGY

## 2022-06-04 MED ORDER — ORAL CARE MOUTH RINSE: 15.0000 mL | OROMUCOSAL | Status: DC | PRN

## 2022-06-04 NOTE — Progress Notes (Signed)
Neurosurgery Service Progress Note  Subjective: No acute events overnight, no issues with speech, tired but otherwise doing well  Objective: Vitals:   06/03/22 1920 06/03/22 2353 06/04/22 0617 06/04/22 0736  BP:  (!) 121/50 (!) 126/48 (!) 138/53  Pulse:  (!) 52 (!) 50 (!) 57  Resp:  18  16  Temp: 98.9 F (37.2 C) 98.4 F (36.9 C) 99 F (37.2 C) 98.7 F (37.1 C)  TempSrc: Oral Oral Oral Oral  SpO2: 91% 91% 91% 93%  Weight:      Height:        Physical Exam: Aox3, PERRL, EOMI, FS TM strength 5/5x4, no drift, speech fluent with normal content  Assessment & Plan: 76 y.o. woman s/p resection L frontal meningioma, recovering well. Post-op MRI with small residual.  -discharge home today  Judith Part  06/04/22 9:17 AM

## 2022-06-04 NOTE — Progress Notes (Signed)
Pt discharged home with husband and daughter. 2 PIV's removed. Pt's belongings all packed. Pt and her daughter educated on all discharge information, with no questions at this time.   Justice Rocher, RN

## 2022-06-04 NOTE — Progress Notes (Signed)
Occupational Therapy Treatment Patient Details Name: Annette Wilson MRN: [id number] DOB: 1945-12-09 Today's Date: 06/04/2022   History of present illness 76 y.o. left handed woman with a history of paroxysmal episodes of aphasia. 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  She is now s/p craniotomy for L extra axial brain tumor.  PMH: remote history of hepatitis, scarlet fever, and duodenal ulcer.   OT comments  Pt making good progress with OT completing grooming, toileting and dressing in room with supervision only. Pt requires cues for hand placement when fatigued and using walker.  Pt's daughter and husband will be home with her 24/7 after d/c for first two weeks. Spoke with pt about taking her time, fall prevention and getting good rest balanced with some regular activity in the house. Pt safe for d/c home from OT standpoint.   Recommendations for follow up therapy are one component of a multi-disciplinary discharge planning process, led by the attending physician.  Recommendations may be updated based on patient status, additional functional criteria and insurance authorization.    Follow Up Recommendations  No OT follow up    Assistance Recommended at Discharge PRN  Patient can return home with the following  Assistance with cooking/housework;Assist for transportation;Help with stairs or ramp for entrance   Equipment Recommendations  None recommended by OT    Recommendations for Other Services      Precautions / Restrictions Precautions Precautions: Fall Restrictions Weight Bearing Restrictions: No       Mobility Bed Mobility Overal bed mobility: Modified Independent             General bed mobility comments: No assist needed, bedrails not used and bed flat    Transfers Overall transfer level: Needs assistance Equipment used: Rolling walker (2 wheels) Transfers: Sit to/from Stand Sit to Stand: Supervision           General  transfer comment: superivsion with all transfers. Cues for hand placement when using walker     Balance Overall balance assessment: Needs assistance Sitting-balance support: Feet supported Sitting balance-Leahy Scale: Good Sitting balance - Comments: Able to donn socks and cross LEs without LOB   Standing balance support: During functional activity, Bilateral upper extremity supported Standing balance-Leahy Scale: Fair Standing balance comment: able to let go of walker for extended time to groom and to stand and manage clothing.                           ADL either performed or assessed with clinical judgement   ADL Overall ADL's : Needs assistance/impaired Eating/Feeding: Independent;Sitting   Grooming: Wash/dry hands;Wash/dry face;Oral care;Supervision/safety;Standing Grooming Details (indicate cue type and reason): stood at sink for 2 minutes         Upper Body Dressing : Set up;Sitting   Lower Body Dressing: Supervision/safety;Sit to/from stand   Toilet Transfer: Min guard;Ambulation;Rolling walker (2 wheels);Comfort height toilet;Grab bars Toilet Transfer Details (indicate cue type and reason): Pt walked to bathroom with walker, toileted and groomed at sink with supervision only and one cue to not leave walker behind. Toileting- Clothing Manipulation and Hygiene: Supervision/safety;Sit to/from stand       Functional mobility during ADLs: Supervision/safety;Rolling walker (2 wheels) General ADL Comments: Pt moved slowly during adls with walker but did not require physical assist. Further cognitive testing done and pt appears intact outside of feeling woozy and having mildly slow processing.    Extremity/Trunk Assessment Upper Extremity Assessment Upper  Extremity Assessment: Overall WFL for tasks assessed   Lower Extremity Assessment Lower Extremity Assessment: Defer to PT evaluation        Vision   Vision Assessment?: No apparent visual deficits    Perception Perception Perception: Not tested   Praxis Praxis Praxis: Intact    Cognition Arousal/Alertness: Awake/alert Behavior During Therapy: WFL for tasks assessed/performed Overall Cognitive Status: Within Functional Limits for tasks assessed                                 General Comments: Pt mildly slow to respond but always responds accurately. Pt states she feels a bit "foggy." Pt STM intact, answered problem solving questions slowly but with 100% accuracy and money managment tasks completed without assist.        Exercises      Shoulder Instructions       General Comments Pt continues to improve. Pt with mild nausea upon ambulating in hallway that resolved when pt sat down.  Pt feesl weak but was overall supervision for all activity in her room. Husband and daughter to be with pt at all times for first two weeks. Encouraged rest with balance of basic activity.    Pertinent Vitals/ Pain       Pain Assessment Pain Assessment: 0-10 Pain Score: 6  Pain Location: headache, neck sore from positioning in surgery Pain Descriptors / Indicators: Headache Pain Intervention(s): Limited activity within patient's tolerance, Monitored during session, Patient requesting pain meds-RN notified  Home Living                                          Prior Functioning/Environment              Frequency  Min 2X/week        Progress Toward Goals  OT Goals(current goals can now be found in the care plan section)  Progress towards OT goals: Progressing toward goals  Acute Rehab OT Goals Patient Stated Goal: to go home today OT Goal Formulation: With patient Time For Goal Achievement: 06/17/22 Potential to Achieve Goals: Good ADL Goals Pt Will Perform Lower Body Bathing: with modified independence;sit to/from stand Pt Will Perform Lower Body Dressing: with modified independence;sit to/from stand Pt Will Transfer to Toilet: with modified  independence;ambulating;grab bars;regular height toilet Pt Will Perform Toileting - Clothing Manipulation and hygiene: with modified independence;sit to/from stand Pt Will Perform Tub/Shower Transfer: with modified independence;shower seat;rolling walker;Shower transfer  Plan Discharge plan remains appropriate    Co-evaluation                 AM-PAC OT "6 Clicks" Daily Activity     Outcome Measure   Help from another person eating meals?: None Help from another person taking care of personal grooming?: None Help from another person toileting, which includes using toliet, bedpan, or urinal?: None Help from another person bathing (including washing, rinsing, drying)?: None Help from another person to put on and taking off regular upper body clothing?: None Help from another person to put on and taking off regular lower body clothing?: None 6 Click Score: 24    End of Session Equipment Utilized During Treatment: Gait belt  OT Visit Diagnosis: Unsteadiness on feet (R26.81)   Activity Tolerance Patient tolerated treatment well   Patient Left in chair;with call bell/phone within reach  Nurse Communication Mobility status        Time: 0932-3557 OT Time Calculation (min): 31 min  Charges: OT General Charges $OT Visit: 1 Visit OT Treatments $Self Care/Home Management : 23-37 mins   Glenford Peers 06/04/2022, 11:34 AM

## 2022-06-04 NOTE — Discharge Summary (Signed)
Discharge Summary  Date of Admission: 06/02/2022  Date of Discharge: 06/04/22  Attending Physician: Emelda Brothers, MD  Hospital Course: Patient was admitted following an uncomplicated left craniotomy for tumor resection. They were recovered in PACU and transferred to 4N ICU. No new deficits post-op, MRI showed expected subtotal resection with small residual. Her hospital course was uncomplicated and the patient was discharged home on 06/04/22. They will follow up in clinic with me in clinic in 2 weeks.  Neurologic exam at discharge:  Strength 5/5 x4 and SILTx4, speech fluent with normal content  Discharge diagnosis: Brain tumor  Judith Part, MD 06/04/22 9:18 AM

## 2022-06-18 ENCOUNTER — Encounter (HOSPITAL_COMMUNITY): Payer: Self-pay | Admitting: Neurological Surgery

## 2022-08-17 DIAGNOSIS — M8580 Other specified disorders of bone density and structure, unspecified site: Secondary | ICD-10-CM | POA: Insufficient documentation

## 2023-03-12 DIAGNOSIS — M170 Bilateral primary osteoarthritis of knee: Secondary | ICD-10-CM | POA: Diagnosis not present

## 2023-03-12 DIAGNOSIS — H9190 Unspecified hearing loss, unspecified ear: Secondary | ICD-10-CM | POA: Diagnosis not present

## 2023-03-12 DIAGNOSIS — E559 Vitamin D deficiency, unspecified: Secondary | ICD-10-CM | POA: Diagnosis not present

## 2023-03-12 DIAGNOSIS — Z0000 Encounter for general adult medical examination without abnormal findings: Secondary | ICD-10-CM | POA: Diagnosis not present

## 2023-03-12 DIAGNOSIS — M8589 Other specified disorders of bone density and structure, multiple sites: Secondary | ICD-10-CM | POA: Diagnosis not present

## 2023-03-12 DIAGNOSIS — Z23 Encounter for immunization: Secondary | ICD-10-CM | POA: Diagnosis not present

## 2023-03-12 DIAGNOSIS — Z1211 Encounter for screening for malignant neoplasm of colon: Secondary | ICD-10-CM | POA: Diagnosis not present

## 2023-03-12 DIAGNOSIS — R7303 Prediabetes: Secondary | ICD-10-CM | POA: Diagnosis not present

## 2023-03-12 DIAGNOSIS — E785 Hyperlipidemia, unspecified: Secondary | ICD-10-CM | POA: Diagnosis not present

## 2023-03-12 DIAGNOSIS — Z79899 Other long term (current) drug therapy: Secondary | ICD-10-CM | POA: Diagnosis not present

## 2023-03-13 DIAGNOSIS — Z1231 Encounter for screening mammogram for malignant neoplasm of breast: Secondary | ICD-10-CM | POA: Diagnosis not present

## 2023-03-13 DIAGNOSIS — Z6826 Body mass index (BMI) 26.0-26.9, adult: Secondary | ICD-10-CM | POA: Diagnosis not present

## 2023-03-13 DIAGNOSIS — Z01419 Encounter for gynecological examination (general) (routine) without abnormal findings: Secondary | ICD-10-CM | POA: Diagnosis not present

## 2023-03-25 DIAGNOSIS — Z1212 Encounter for screening for malignant neoplasm of rectum: Secondary | ICD-10-CM | POA: Diagnosis not present

## 2023-03-25 DIAGNOSIS — Z1211 Encounter for screening for malignant neoplasm of colon: Secondary | ICD-10-CM | POA: Diagnosis not present

## 2023-04-29 DIAGNOSIS — Z23 Encounter for immunization: Secondary | ICD-10-CM | POA: Diagnosis not present

## 2023-06-01 DIAGNOSIS — H353131 Nonexudative age-related macular degeneration, bilateral, early dry stage: Secondary | ICD-10-CM | POA: Diagnosis not present

## 2023-06-01 DIAGNOSIS — H52203 Unspecified astigmatism, bilateral: Secondary | ICD-10-CM | POA: Diagnosis not present

## 2023-08-06 DIAGNOSIS — L2089 Other atopic dermatitis: Secondary | ICD-10-CM | POA: Diagnosis not present

## 2023-08-11 ENCOUNTER — Other Ambulatory Visit (HOSPITAL_COMMUNITY): Payer: Self-pay | Admitting: Neurological Surgery

## 2023-08-11 DIAGNOSIS — D320 Benign neoplasm of cerebral meninges: Secondary | ICD-10-CM

## 2023-08-19 DIAGNOSIS — N958 Other specified menopausal and perimenopausal disorders: Secondary | ICD-10-CM | POA: Diagnosis not present

## 2023-08-19 DIAGNOSIS — M8588 Other specified disorders of bone density and structure, other site: Secondary | ICD-10-CM | POA: Diagnosis not present

## 2023-08-19 LAB — HM DEXA SCAN

## 2023-08-22 ENCOUNTER — Ambulatory Visit (HOSPITAL_COMMUNITY)
Admission: RE | Admit: 2023-08-22 | Discharge: 2023-08-22 | Disposition: A | Discharge status: Home / Self Care | Payer: Medicare Other | Source: Ambulatory Visit | Attending: Neurological Surgery | Admitting: Neurological Surgery

## 2023-08-22 DIAGNOSIS — D320 Benign neoplasm of cerebral meninges: Secondary | ICD-10-CM | POA: Diagnosis not present

## 2023-08-22 DIAGNOSIS — R9089 Other abnormal findings on diagnostic imaging of central nervous system: Secondary | ICD-10-CM | POA: Diagnosis not present

## 2023-08-22 MED ORDER — GADOBUTROL 1 MMOL/ML IV SOLN
6.5000 mL | Freq: Once | INTRAVENOUS | Status: AC | PRN
  Administered 2023-08-22: 6.5 mL via INTRAVENOUS

## 2023-08-31 NOTE — Progress Notes (Signed)
 LILLETTE Ileana Collet, PhD, LAT, ATC acting as a scribe for Artist Lloyd, MD.  Annette Wilson is a 78 y.o. female who presents to [facility] at [facility] today for osteoporosis management. She notes worsening bilat knee pain that is keeping her from exercising.   DEXA scan (date, T-score): 08/19/23: Spine= 2.9, L-FN= -1.5, R-FN= -1.3, R forearm= -2.3 Prior treatment: none History of Hip, Spine, or Wrist Fx: no Heart disease or stroke: no Cancer: no Kidney Disease: no Gastric/Peptic Ulcer: duodenal ulcer as a teen Gastric bypass surgery: no Severe GERD: no Hx of seizures: no Age at Menopause: 78 y/o Calcium intake: no Vitamin D  intake: yes- 50,000 units once a wk Hormone replacement therapy: yes- estradiol Smoking history: never smoked Alcohol: no Exercise: yes- walking ~67mins/day Major dental work in past year: no Parents with hip/spine fracture: yes- mom, hip fx Height loss: yes: 64.75 to 63    Additionally has chronic bilateral knee pain.  This is interfere with her ability to do weight lifting or daily weightbearing exercise.  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  She is not taking any medications for this.  Pertinent review of systems: No fevers or chills  Relevant historical information: History of a meningioma. Knee arthritis  Exam:  BP 122/84   Pulse (!) 59   Ht 5' 3 (1.6 m)   Wt 152 lb (68.9 kg)   SpO2 98%   BMI 26.93 kg/m  General: Well Developed, well nourished, and in no acute distress.   MSK: Bilateral knees normal-appearing normal motion with crepitation.  Tender palpation medial joint line.    Lab and Radiology Results  X-ray images bilateral knees obtained today personally and independently interpreted.  Right knee: Severe medial compartment DJD.  Mild patellofemoral DJD.  Left knee: Severe medial DJD.  Mild patellofemoral DJD.  Await formal radiology review   [facility]  Right forearm T-score -2.3.  Bilateral femoral necks -1.5 and -1.3.  Please see scanned document.  Assessment and Plan: 78 y.o. female with osteopenia.  She generally is doing a pretty good job with conservative management.  She is taking vitamin D  and eating a diet rich in calcium.  She is doing a fair amount of weightbearing exercise.  She is unable to optimize her weightbearing exercise because of knee pain due to knee arthritis.  That is discussed below.  She is a good candidate for physical therapy or osteo strong to help with resistance training.  We did talk about medications a bit but conservative management should be the main focus for now.  Will check vitamin D  to make sure her levels are optimized.  She is taking 50,000 units of vitamin D2 twice weekly.  Bilateral knee pain due to DJD.  Plan for physical therapy.  Consider steroid injection on recheck.  Recheck in 6 to 8 weeks.   PDMP not reviewed this encounter. Orders Placed This Encounter  Procedures   DG Knee AP/LAT W/Sunrise Left    Standing Status:   Future    Number of Occurrences:   1    Expiration Date:   09/29/2023    Reason for Exam (SYMPTOM  OR DIAGNOSIS REQUIRED):   bilateral knee pain    Preferred imaging location?:   [facility]   DG Knee AP/LAT W/Sunrise Right    Standing Status:   Future    Number of Occurrences:   1  Expiration Date:   09/29/2023    Reason for Exam (SYMPTOM  OR DIAGNOSIS REQUIRED):   bilateral knee pain    Preferred imaging location?:   [facility]   VITAMIN D  25 Hydroxy (Vit-D Deficiency, Fractures)    Standing Status:   Future    Number of Occurrences:   1    Expiration Date:   08/31/2024   Ambulatory referral to Physical Therapy    Referral Priority:   Routine    Referral Type:   Physical Medicine    Referral Reason:   Specialty Services Required    Requested Specialty:   Physical Therapy    Number of Visits Requested:   1   No orders  of the defined types were placed in this encounter.    Discussed warning signs or symptoms. Please see discharge instructions. Patient expresses understanding.   The above documentation has been reviewed and is accurate and complete Artist Lloyd, M.D.

## 2023-09-01 ENCOUNTER — Ambulatory Visit (INDEPENDENT_AMBULATORY_CARE_PROVIDER_SITE_OTHER): Payer: Medicare Other | Admitting: Family Medicine

## 2023-09-01 ENCOUNTER — Ambulatory Visit (INDEPENDENT_AMBULATORY_CARE_PROVIDER_SITE_OTHER): Payer: Medicare Other

## 2023-09-01 VITALS — BP 122/84 | HR 59 | Ht 63.0 in | Wt 152.0 lb

## 2023-09-01 DIAGNOSIS — M170 Bilateral primary osteoarthritis of knee: Secondary | ICD-10-CM | POA: Insufficient documentation

## 2023-09-01 DIAGNOSIS — M25562 Pain in left knee: Secondary | ICD-10-CM | POA: Diagnosis not present

## 2023-09-01 DIAGNOSIS — G8929 Other chronic pain: Secondary | ICD-10-CM | POA: Diagnosis not present

## 2023-09-01 DIAGNOSIS — M85851 Other specified disorders of bone density and structure, right thigh: Secondary | ICD-10-CM

## 2023-09-01 DIAGNOSIS — M85852 Other specified disorders of bone density and structure, left thigh: Secondary | ICD-10-CM

## 2023-09-01 DIAGNOSIS — M25561 Pain in right knee: Secondary | ICD-10-CM | POA: Diagnosis not present

## 2023-09-01 DIAGNOSIS — E785 Hyperlipidemia, unspecified: Secondary | ICD-10-CM | POA: Insufficient documentation

## 2023-09-01 LAB — VITAMIN D 25 HYDROXY (VIT D DEFICIENCY, FRACTURES): VITD: 41.6 ng/mL (ref 30.00–100.00)

## 2023-09-01 NOTE — Patient Instructions (Addendum)
Thank you for coming in today.   Please get an Xray today before you leave   I've referred you to Physical Therapy.  Let us know if you don't hear from them in one week.   Please get labs today before you leave   Check back in 6 weeks

## 2023-09-02 ENCOUNTER — Encounter: Payer: Self-pay | Admitting: Family Medicine

## 2023-09-02 NOTE — Progress Notes (Signed)
 Vitamin D  level is okay.  Continue current regimen.

## 2023-09-09 ENCOUNTER — Other Ambulatory Visit: Payer: Self-pay | Admitting: Radiation Therapy

## 2023-09-09 DIAGNOSIS — D320 Benign neoplasm of cerebral meninges: Secondary | ICD-10-CM | POA: Diagnosis not present

## 2023-09-09 DIAGNOSIS — Z6826 Body mass index (BMI) 26.0-26.9, adult: Secondary | ICD-10-CM | POA: Diagnosis not present

## 2023-09-09 NOTE — Therapy (Incomplete)
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   Duodenal ulcer 1961   Hepatitis 1951   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   Past Surgical History:  Procedure Laterality Date   APPLICATION OF CRANIAL NAVIGATION N/A 06/02/2022   Procedure: APPLICATION OF CRANIAL NAVIGATION;  Surgeon: Jadene Pierini, MD;  Location: [facility] OR;  Service: Neurosurgery;  Laterality: N/A;   CRANIOTOMY Left 06/02/2022   Procedure: Left craniotomy for tumor resection;  Surgeon: Jadene Pierini, MD;  Location: [facility] OR;  Service: Neurosurgery;  Laterality: Left;  RM 20   DIAGNOSTIC LAPAROSCOPY  1978   looking for fallopian tube blockage   HAND SURGERY Left    tumor removed between 1 & 2 nd digits   TONSILLECTOMY  1954   Patient Active Problem List   Diagnosis Date Noted   Mild hyperlipidemia 09/01/2023   Bilateral primary osteoarthritis of knee 09/01/2023   Osteopenia 08/17/2022   Brain tumor (HCC) 06/02/2022    PCP: Thana Ates, MD   REFERRING PROVIDER: Rodolph Bong, MD   REFERRING DIAG:  M25.561,M25.562,G89.29 (ICD-10-CM) - Chronic pain of both knees  M81.0 (ICD-10-CM) - Age-related osteoporosis without current pathological fracture    THERAPY DIAG:  No diagnosis found.  Rationale for Evaluation and Treatment: Rehabilitation  ONSET DATE: ***  SUBJECTIVE:   SUBJECTIVE STATEMENT: ***  PERTINENT HISTORY: Brain tumor removed 2023 PAIN:  Are you having pain? Yes: NPRS scale: *** Pain location: *** Pain description: *** Aggravating factors: *** Relieving factors: ***  PRECAUTIONS: {Therapy precautions:24002}  RED FLAGS: {PT Red Flags:29287}   WEIGHT BEARING RESTRICTIONS: No  FALLS:  Has patient fallen in last 6  months? {fallsyesno:27318}  LIVING ENVIRONMENT: Lives with: {OPRC lives with:25569::"lives with their family"} Lives in: {Lives in:25570} Stairs: {opstairs:27293} Has following equipment at home: {Assistive devices:23999}  OCCUPATION: ***  PLOF: {PLOF:24004}  PATIENT GOALS: ***  NEXT MD VISIT: ***  OBJECTIVE:  Note: Objective measures were completed at Evaluation unless otherwise noted.  DIAGNOSTIC FINDINGS: ***  PATIENT SURVEYS:  LEFS ***   COGNITION: Overall cognitive status: Within functional limits for tasks assessed     SENSATION: {sensation:27233}  EDEMA:  {edema:24020}  MUSCLE LENGTH: Tightness in *** B HS, quads, hip flexors, piriformis, gastrocs  POSTURE: {posture:25561}  PALPATION: ***  LOWER EXTREMITY ROM:  {AROM/PROM:27142} ROM Right eval Left eval  Hip flexion    Hip extension    Hip abduction    Hip adduction    Hip internal rotation    Hip external rotation    Knee flexion    Knee extension    Ankle dorsiflexion    Ankle plantarflexion    Ankle inversion    Ankle eversion     (Blank rows = not tested)  LOWER EXTREMITY MMT:  MMT Right eval Left eval  Hip flexion    Hip extension    Hip abduction    Hip adduction    Hip internal rotation    Hip external rotation    Knee flexion    Knee extension    Ankle dorsiflexion    Ankle plantarflexion    Ankle inversion    Ankle eversion     (Blank rows = not tested)  LOWER EXTREMITY SPECIAL TESTS:  Knee special tests: {KNEE SPECIAL TESTS:26240}  FUNCTIONAL TESTS:  {Functional tests:24029}  GAIT: Distance walked: *** Assistive device utilized: {Assistive devices:23999} Level of assistance: {Levels of assistance:24026} Comments: ***                                                                                                                                TREATMENT DATE: ***  09/10/23  See pt ed and HEP    PATIENT EDUCATION:  Education details: {Education  details:27468}  Person educated: Patient Education method: Explanation, Demonstration, and Handouts Education comprehension: verbalized understanding and returned demonstration  HOME EXERCISE PROGRAM: ***  ASSESSMENT:  CLINICAL IMPRESSION: Patient is a 78 y.o. female who was seen today for physical therapy evaluation and treatment for B knee pain. ***.   OBJECTIVE IMPAIRMENTS: {opptimpairments:25111}.   ACTIVITY LIMITATIONS: {activitylimitations:27494}  PARTICIPATION LIMITATIONS: {participationrestrictions:25113}  PERSONAL FACTORS: {Personal factors:25162} are also affecting patient's functional outcome.   REHAB POTENTIAL: {rehabpotential:25112}  CLINICAL DECISION MAKING: {clinical decision making:25114}  EVALUATION COMPLEXITY: {Evaluation complexity:25115}   SHORT TERM GOALS: Target date: {follow up:25551}    Ind with initial HEP Baseline: Goal status: INITIAL  2.  Able to safely ambulate without AD Baseline:  Goal status: INITIAL  3.  Decreased knee pain by 25%. Baseline:  Goal status: INITIAL   LONG TERM GOALS: Target date: {follow up:25551}   Ind with advanced HEP and its progression Baseline:  Goal status: INITIAL  2. Improved R knee ROM to 0-125 deg to normalize gait and functional mobility Baseline:  Goal status: INITIAL  3.  Improved LE strength to 5/5 to improve function Baseline:  Goal status: INITIAL  4.  Able to climb stairs with a reciprocal gait pattern Baseline:  Goal status: INITIAL  5.  Decreased knee pain in the by >=75% with ADLs. Baseline:  Goal status: INITIAL  6.  Pt able to safely amb community distances without significant gait deviations. Baseline:  Goal status: INITIAL  7.  Improved LEFS by >= 9 points showing functional improvement Baseline:  Goal status:INITIAL    PLAN:  PT FREQUENCY: {rehab frequency:25116}  PT DURATION: {rehab duration:25117}  PLANNED INTERVENTIONS: {rehab planned  interventions:25118::"97110-Therapeutic exercises","97530- Therapeutic [phone]- Neuromuscular re-education","97535- Self JXBJ","47829- Manual therapy"}  PLAN FOR NEXT SESSION: ***   Solon Palm, PT  09/09/2023, 8:48 PM

## 2023-09-10 ENCOUNTER — Ambulatory Visit: Payer: Medicare Other | Attending: Family Medicine

## 2023-09-10 ENCOUNTER — Other Ambulatory Visit: Payer: Self-pay

## 2023-09-10 DIAGNOSIS — M85852 Other specified disorders of bone density and structure, left thigh: Secondary | ICD-10-CM | POA: Diagnosis not present

## 2023-09-10 DIAGNOSIS — M25561 Pain in right knee: Secondary | ICD-10-CM | POA: Diagnosis not present

## 2023-09-10 DIAGNOSIS — M6281 Muscle weakness (generalized): Secondary | ICD-10-CM | POA: Insufficient documentation

## 2023-09-10 DIAGNOSIS — M25562 Pain in left knee: Secondary | ICD-10-CM | POA: Insufficient documentation

## 2023-09-10 DIAGNOSIS — R252 Cramp and spasm: Secondary | ICD-10-CM | POA: Insufficient documentation

## 2023-09-10 DIAGNOSIS — R262 Difficulty in walking, not elsewhere classified: Secondary | ICD-10-CM | POA: Insufficient documentation

## 2023-09-10 DIAGNOSIS — M85851 Other specified disorders of bone density and structure, right thigh: Secondary | ICD-10-CM | POA: Insufficient documentation

## 2023-09-10 DIAGNOSIS — G8929 Other chronic pain: Secondary | ICD-10-CM | POA: Insufficient documentation

## 2023-09-10 DIAGNOSIS — R293 Abnormal posture: Secondary | ICD-10-CM | POA: Diagnosis not present

## 2023-09-10 NOTE — Therapy (Signed)
OUTPATIENT PHYSICAL THERAPY LOWER EXTREMITY EVALUATION   Patient Name: Annette Wilson MRN: [id number] DOB:12-10-1945, 78 y.o., female Today's Date: 09/10/2023  END OF SESSION:  PT End of Session - 09/10/23 1236     Visit Number 1    Date for PT Re-Evaluation 11/05/23    Authorization Type Medicare    PT Start Time 1237    PT Stop Time 1323    PT Time Calculation (min) 46 min    Activity Tolerance Patient tolerated treatment well    Behavior During Therapy [facility] for tasks assessed/performed             Past Medical History:  Diagnosis Date   Abscess 1954   developed abscesses after her tonsilectomy   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   Duodenal ulcer 1961   Hepatitis 1951   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   Past Surgical History:  Procedure Laterality Date   APPLICATION OF CRANIAL NAVIGATION N/A 06/02/2022   Procedure: APPLICATION OF CRANIAL NAVIGATION;  Surgeon: Jadene Pierini, MD;  Location: [facility] OR;  Service: Neurosurgery;  Laterality: N/A;   CRANIOTOMY Left 06/02/2022   Procedure: Left craniotomy for tumor resection;  Surgeon: Jadene Pierini, MD;  Location: [facility] OR;  Service: Neurosurgery;  Laterality: Left;  RM 20   DIAGNOSTIC LAPAROSCOPY  1978   looking for fallopian tube blockage   HAND SURGERY Left    tumor removed between 1 & 2 nd digits   TONSILLECTOMY  1954   Patient Active Problem List   Diagnosis Date Noted   Mild hyperlipidemia 09/01/2023   Bilateral primary osteoarthritis of knee 09/01/2023   Osteopenia 08/17/2022   Brain tumor (HCC) 06/02/2022    PCP: Thana Ates, MD  REFERRING PROVIDER: Rodolph Bong, MD  REFERRING DIAG:    M25.561,M25.562,G89.29 (ICD-10-CM) - Chronic pain of both knees  M81.0 (ICD-10-CM) - Age-related osteoporosis without current pathological fracture    THERAPY DIAG:  Chronic pain of right knee - Plan: PT plan of care cert/re-cert  Chronic pain of left knee - Plan: PT plan of care cert/re-cert  Muscle weakness  (generalized) - Plan: PT plan of care cert/re-cert  Difficulty in walking, not elsewhere classified - Plan: PT plan of care cert/re-cert  Cramp and spasm - Plan: PT plan of care cert/re-cert  Abnormal posture - Plan: PT plan of care cert/re-cert  Rationale for Evaluation and Treatment: Rehabilitation  ONSET DATE: 09/01/2023  SUBJECTIVE:   SUBJECTIVE STATEMENT: Patient reports she has been having bilateral knee pain for about 7 years.  She recalls lifting boards and going up and down steps at their home when she first noticed it.  They were putting new floors down in her home and she was helping bring boards up from downstairs.  She has been dealing with some form of pain since that time.  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  She is married and lives with her spouse.  She just had her 1 year follow up for brain tumor for left temporal lobe.  The tumor is benign but she may have to have radiation due to it growing again.   She understands that her knees likely need to be replaced but she can't focus on this at this moment due to the situation with the tumor.  Her spouse also just had lumbar fusion and she had been taking care of him for the past few months.  She hopes to establish a home program of LE strengthening and  be able to gain strength in her legs.  She hopes to put off need for TKA's for as long as possible.    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  Removed one year ago but surgeon could not get all of tumor due to some border attachment to the brain tissue.  It is beginning to grow again and she will likely be having radiation vs surgery this time.   PAIN:  Are you having pain? Yes: NPRS scale: 0/10 at rest , 8/10 Pain location: bilateral knees, also low back Pain description: aching Aggravating factors: start up, stairs Relieving factors: nothing  PRECAUTIONS: None  RED FLAGS: None   WEIGHT BEARING RESTRICTIONS: No  FALLS:  Has patient fallen in last 6  months? No  LIVING ENVIRONMENT: Lives with: lives with their family Lives in: House/apartment Stairs: Yes: Internal: 12 steps; on right going up and External: 2 steps; none Has following equipment at home: None  OCCUPATION: Desk job   PLOF: Independent, Independent with basic ADLs, Independent with household mobility without device, Independent with community mobility without device, Independent with homemaking with ambulation, Independent with gait, and Independent with transfers  PATIENT GOALS: To be able avoid losing more bone due to osteoporosis.    NEXT MD VISIT:  Evaluate and Treat for bilat knee pain and osteoporosis 1-2 times per week for 4-6 weeks.   Decrease pain, increase strength, flexibility, function, and range of motion.  Modalities may include, traction, ionto, phono, stim, and dry needling prn.  OBJECTIVE:  Note: Objective measures were completed at Evaluation unless otherwise noted.  DIAGNOSTIC FINDINGS: Images not read yet but images show pronounced varus bilaterally with limited tibiofemoral space on both (Left >Right)  PATIENT SURVEYS:  LEFS 30/80  COGNITION: Overall cognitive status: Within functional limits for tasks assessed     SENSATION: WFL  POSTURE:  Pronounced varus deformity bilateral LE's Left > Right  PALPATION: Moderate global crepitus right knee, fairly isolated patellofemoral crepitus on left.   LOWER EXTREMITY ROM:  Lacking approx 10 degrees of full extension bilateral knees, otherwise ROM is [facility]  LOWER EXTREMITY MMT:  Hips generally 4/5 with exception of hip ER which was 4-/5 bilaterally,  hip extension 4-/5.    LOWER EXTREMITY SPECIAL TESTS:  Knee special tests: Apley's compression test: positive  and Patella tap test (ballotable patella): negative  FUNCTIONAL TESTS:  5 times sit to stand: 16.85 sec Timed up and go (TUG): 12.54 sec  GAIT: Distance walked: 30 feet Assistive device utilized: None Level of assistance:  Modified independence Comments: slow, guarded, pronounced varus bilateral (Left > Right)                                                                                                                                TREATMENT DATE: 09/10/23 Initial eval completed and initiated HEP    PATIENT EDUCATION:  Education details: Initiated HEP Person educated: Patient Education method: Programmer, multimedia, Demonstration, and  Handouts Education comprehension: verbalized understanding, returned demonstration, and verbal cues required  HOME EXERCISE PROGRAM: Access Code: [email or web address] URL: [email or web address] Date: 09/10/2023 Prepared by: Mikey Kirschner  Exercises - Supine Quadricep Sets  - 1 x daily - 7 x weekly - 3 sets - 10 reps - Small Range Straight Leg Raise  - 1 x daily - 7 x weekly - 3 sets - 10 reps - Seated Long Arc Quad  - 1 x daily - 7 x weekly - 3 sets - 10 reps  ASSESSMENT:  CLINICAL IMPRESSION: Patient is a 78 y.o. female who was seen today for physical therapy evaluation and treatment for bilateral knee pain and need for weight bearing exercises due to osteoporosis.  She recently had brain tumor removed which was benign and she had been caring for her husband who had lumbar fusion surgery recently.  She met with her neurosurgeon this week and the brain tumor is beginning to grow again.  Due to this being at the temporal lobe, surgeon believes radiation would be most appropriate.  She presents with obvious varus deformity of both knees with pronounced crepitus, LE weakness and decreased ROM along with mild acquired scoliosis.   She is not a candidate for TKA's at this time due to the brain tumor.  She ambulates with antalgic, slow, guarded gait but is quite functional.  She requires no assistive device. She would benefit from skilled PT for quad rehab, hip strengthening and modalities to control pain.    OBJECTIVE IMPAIRMENTS: Abnormal gait, decreased balance, decreased  mobility, difficulty walking, decreased ROM, decreased strength, increased fascial restrictions, increased muscle spasms, impaired flexibility, postural dysfunction, and pain.   ACTIVITY LIMITATIONS: carrying, lifting, bending, sitting, standing, squatting, sleeping, stairs, transfers, bathing, toileting, dressing, hygiene/grooming, and caring for others  PARTICIPATION LIMITATIONS: meal prep, cleaning, laundry, driving, shopping, community activity, yard work, and church  PERSONAL FACTORS: Age, Fitness, Time since onset of injury/illness/exacerbation, and 1-2 comorbidities: hx scarlett fever and brain tumor  are also affecting patient's functional outcome.   REHAB POTENTIAL: Good  CLINICAL DECISION MAKING: Stable/uncomplicated  EVALUATION COMPLEXITY: Low   GOALS: Goals reviewed with patient? Yes  SHORT TERM GOALS: Target date: 10/08/2023  Pain report to be no greater than 4/10  Baseline: Goal status: INITIAL  2.  Patient will be independent with initial HEP  Baseline:  Goal status: INITIAL  LONG TERM GOALS: Target date: 11/05/2023   Pain report to be no greater than 4/10  Baseline:  Goal status: INITIAL  2.  Patient to be independent with advanced HEP  Baseline:  Goal status: INITIAL  3.  Patient to be able to stand or walk for at least 15 min without leg pain  Baseline:  Goal status: INITIAL  4.  Patient to be able to ascend and descend steps without pain or no greater than 2/10  Baseline:  Goal status: INITIAL  5.  Patient to be able to bend, stoop and squat with pain no greater than 2/10  Baseline:  Goal status: INITIAL  6.  LEFS score to improve by 2-4 points Baseline:  Goal status: INITIAL   PLAN:  PT FREQUENCY: 2x/week  PT DURATION: 8 weeks  PLANNED INTERVENTIONS: 97110-Therapeutic exercises, 97530- Therapeutic activity, [id number]- Neuromuscular re-education, 97535- Self Care, 82956- Manual therapy, [id number]- Gait training, [phone]- Canalith repositioning,  [id number]- Aquatic Therapy, 97760- Splinting, 97014- Electrical stimulation (unattended), [id number]- Electrical stimulation (manual), [id number]- Vasopneumatic device, [id number]- Ultrasound, [id number]- Ionotophoresis 4mg /ml Dexamethasone, Patient/Family education, Balance training, Stair training, Taping, Dry Needling, Joint  mobilization, Scar mobilization, Compression bandaging, Vestibular training, Visual/preceptual remediation/compensation, DME instructions, Cryotherapy, and Moist heat  PLAN FOR NEXT SESSION: Review HEP, Nustep, begin quad rehab, glut med strengthening.    Victorino Dike B. Eldor Conaway, PT 09/10/23 3:20 PM [facility] [address] [place], [place] [address] Phone # [phone] Fax [phone]

## 2023-09-14 ENCOUNTER — Encounter: Payer: Self-pay | Admitting: Family Medicine

## 2023-09-14 ENCOUNTER — Inpatient Hospital Stay: Payer: Medicare Other | Attending: Neurological Surgery

## 2023-09-14 NOTE — Progress Notes (Signed)
Left knee x-ray shows some arthritis.

## 2023-09-14 NOTE — Progress Notes (Signed)
Right knee x-ray shows some arthritis.

## 2023-09-17 ENCOUNTER — Other Ambulatory Visit: Payer: Self-pay | Admitting: Radiation Therapy

## 2023-09-17 DIAGNOSIS — D329 Benign neoplasm of meninges, unspecified: Secondary | ICD-10-CM

## 2023-09-22 NOTE — Progress Notes (Signed)
 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 Maisie Fus 06/02/2022 History of paroxysmal episodes of aphasia  MRI Brain W WO Contrast 08/22/2023   Past or anticipated interventions, if any, per neurosurgery:  Dr. Maisie Fus 06/02/2022 Left Craniotomy and Tumor Resection.  Past or anticipated interventions, if any, per medical oncology:  Radiation   Dose of Decadron, if applicable: None  Recent neurologic symptoms, if any:  Seizures: None Headaches: None Nausea: None Dizziness/ataxia: None Difficulty with hand coordination: None Focal numbness/weakness: None Visual deficits/changes: None Confusion/Memory deficits: None   SAFETY ISSUES: Prior radiation? None Pacemaker/ICD? None Possible current pregnancy? N/A Is the patient on methotrexate? None  Additional Complaints / other details:  None  BP (!) 143/61 (BP Location: Right Arm, Patient Position: Sitting, Cuff Size: Normal)   Pulse (!) 59   Temp 97.7 F (36.5 C)   Resp 18   Ht 5' 3.5" (1.613 m)   Wt 150 lb 9.6 oz (68.3 kg)   SpO2 99%   BMI 26.26 kg/m  Wt Readings from Last 3 Encounters:  09/29/23 150 lb 9.6 oz (68.3 kg)  09/01/23 152 lb (68.9 kg)  06/02/22 143 lb (64.9 kg)

## 2023-09-23 ENCOUNTER — Telehealth: Payer: Self-pay | Admitting: Radiation Oncology

## 2023-09-23 NOTE — Telephone Encounter (Signed)
 2/26 Left voicemail with Dr. Mattie Marlin office, so they are aware patient is now schedule for 3/4 with Dr. Basilio Cairo, starting at 10:30 am.

## 2023-09-24 ENCOUNTER — Ambulatory Visit: Payer: Medicare Other

## 2023-09-24 DIAGNOSIS — R262 Difficulty in walking, not elsewhere classified: Secondary | ICD-10-CM | POA: Diagnosis not present

## 2023-09-24 DIAGNOSIS — M25562 Pain in left knee: Secondary | ICD-10-CM | POA: Diagnosis not present

## 2023-09-24 DIAGNOSIS — M25561 Pain in right knee: Secondary | ICD-10-CM | POA: Diagnosis not present

## 2023-09-24 DIAGNOSIS — G8929 Other chronic pain: Secondary | ICD-10-CM | POA: Diagnosis not present

## 2023-09-24 DIAGNOSIS — R293 Abnormal posture: Secondary | ICD-10-CM

## 2023-09-24 DIAGNOSIS — R252 Cramp and spasm: Secondary | ICD-10-CM | POA: Diagnosis not present

## 2023-09-24 DIAGNOSIS — M6281 Muscle weakness (generalized): Secondary | ICD-10-CM

## 2023-09-24 NOTE — Therapy (Signed)
 OUTPATIENT PHYSICAL THERAPY LOWER EXTREMITY EVALUATION   Patient Name: Annette Wilson MRN: [id number] DOB:04/02/1946, 78 y.o., female Today's Date: 09/24/2023  END OF SESSION:  PT End of Session - 09/24/23 1016     Visit Number 2    Date for PT Re-Evaluation 11/05/23    Authorization Type Medicare    PT Start Time 1016    PT Stop Time 1102    PT Time Calculation (min) 46 min    Activity Tolerance Patient tolerated treatment well    Behavior During Therapy [facility] for tasks assessed/performed             Past Medical History:  Diagnosis Date   Abscess 1954   developed abscesses after her tonsilectomy   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   Duodenal ulcer 1961   Hepatitis 1951   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   Past Surgical History:  Procedure Laterality Date   APPLICATION OF CRANIAL NAVIGATION N/A 06/02/2022   Procedure: APPLICATION OF CRANIAL NAVIGATION;  Surgeon: Jadene Pierini, MD;  Location: [facility] OR;  Service: Neurosurgery;  Laterality: N/A;   CRANIOTOMY Left 06/02/2022   Procedure: Left craniotomy for tumor resection;  Surgeon: Jadene Pierini, MD;  Location: [facility] OR;  Service: Neurosurgery;  Laterality: Left;  RM 20   DIAGNOSTIC LAPAROSCOPY  1978   looking for fallopian tube blockage   HAND SURGERY Left    tumor removed between 1 & 2 nd digits   TONSILLECTOMY  1954   Patient Active Problem List   Diagnosis Date Noted   Mild hyperlipidemia 09/01/2023   Bilateral primary osteoarthritis of knee 09/01/2023   Osteopenia 08/17/2022   Brain tumor (HCC) 06/02/2022    PCP: Thana Ates, MD  REFERRING PROVIDER: Rodolph Bong, MD  REFERRING DIAG:    M25.561,M25.562,G89.29 (ICD-10-CM) - Chronic pain of both knees  M81.0 (ICD-10-CM) - Age-related osteoporosis without current pathological fracture    THERAPY DIAG:  Chronic pain of right knee  Chronic pain of left knee  Muscle weakness (generalized)  Difficulty in walking, not elsewhere classified  Cramp and  spasm  Abnormal posture  Rationale for Evaluation and Treatment: Rehabilitation  ONSET DATE: 09/01/2023  SUBJECTIVE:   SUBJECTIVE STATEMENT: Patient reports she met with her provider regarding the hemangioma.  They would like to do surgery.  She will be meeting with them to see if she can put this off until April.  She would like to work on getting her legs stronger and maybe get some relief of the knee pain.  She explains that she did her exercises pretty regularly since her last visit and does feel some relief of the symptoms.      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  Removed one year ago but surgeon could not get all of tumor due to some border attachment to the brain tissue.  It is beginning to grow again and she will likely be having radiation vs surgery this time.   PAIN:  09/24/23 Are you having pain? Yes: NPRS scale: 0/10 at rest , 7/10 at worst Pain location: bilateral knees, also low back Pain description: aching Aggravating factors: start up, stairs Relieving factors: nothing  PRECAUTIONS: None  RED FLAGS: None   WEIGHT BEARING RESTRICTIONS: No  FALLS:  Has patient fallen in last 6 months? No  LIVING ENVIRONMENT: Lives with: lives with their family Lives in: House/apartment Stairs: Yes: Internal: 12 steps; on right going up and External: 2 steps; none Has following equipment  at home: None  OCCUPATION: Desk job   PLOF: Independent, Independent with basic ADLs, Independent with household mobility without device, Independent with community mobility without device, Independent with homemaking with ambulation, Independent with gait, and Independent with transfers  PATIENT GOALS: To be able avoid losing more bone due to osteoporosis.    NEXT MD VISIT:  Evaluate and Treat for bilat knee pain and osteoporosis 1-2 times per week for 4-6 weeks.   Decrease pain, increase strength, flexibility, function, and range of motion.  Modalities may include,  traction, ionto, phono, stim, and dry needling prn.  OBJECTIVE:  Note: Objective measures were completed at Evaluation unless otherwise noted.  DIAGNOSTIC FINDINGS: Images not read yet but images show pronounced varus bilaterally with limited tibiofemoral space on both (Left >Right)  PATIENT SURVEYS:  LEFS 30/80  COGNITION: Overall cognitive status: Within functional limits for tasks assessed     SENSATION: WFL  POSTURE:  Pronounced varus deformity bilateral LE's Left > Right  PALPATION: Moderate global crepitus right knee, fairly isolated patellofemoral crepitus on left.   LOWER EXTREMITY ROM:  Lacking approx 10 degrees of full extension bilateral knees, otherwise ROM is [facility]  LOWER EXTREMITY MMT:  Hips generally 4/5 with exception of hip ER which was 4-/5 bilaterally,  hip extension 4-/5.    LOWER EXTREMITY SPECIAL TESTS:  Knee special tests: Apley's compression test: positive  and Patella tap test (ballotable patella): negative  FUNCTIONAL TESTS:  5 times sit to stand: 16.85 sec Timed up and go (TUG): 12.54 sec  GAIT: Distance walked: 30 feet Assistive device utilized: None Level of assistance: Modified independence Comments: slow, guarded, pronounced varus bilateral (Left > Right)                                                                                                                                TREATMENT DATE:  09/24/23 Nustep x 5 min level 3 (PT present to discuss status) Seated LAQ with 2# 2 x 10 each LE Supine quad sets x 20 bilateral Supine TKE with blue foam roller with 2# 2 x 10 both Supine SAQ with 2# 2 x 10 both SLR 2 x 10 each LE with 2# Hook lying clam shell with red loop 2 x 10 Side lying clam with yellow loop 2 x 10 each LE Standing lateral band walks with yellow loop 3 laps at back counter Standing hip extension at back counter 2 x 10 each LE Sit to stand 2 x 10 (verbal cues for alignment- had patient sit in front of  mirror)  09/10/23 Initial eval completed and initiated HEP    PATIENT EDUCATION:  Education details: Initiated HEP Person educated: Patient Education method: Programmer, multimedia, Facilities manager, and Handouts Education comprehension: verbalized understanding, returned demonstration, and verbal cues required  HOME EXERCISE PROGRAM: Access Code: [email or web address] URL: [email or web address] Date: 09/24/2023 Prepared by: Mikey Kirschner  Exercises - Supine Quadricep Sets  - 1 x daily - 7 x weekly -  2 sets - 10 reps - Small Range Straight Leg Raise  - 1 x daily - 7 x weekly - 2 sets - 10 reps - Supine Knee Extension Strengthening  - 1 x daily - 7 x weekly - 2 sets - 10 reps - Seated Long Arc Quad  - 1 x daily - 7 x weekly - 2 sets - 10 reps - Sit to Stand Without Arm Support  - 1 x daily - 7 x weekly - 2 sets - 10 reps - Side Stepping with Resistance at Ankles and Counter Support  - 1 x daily - 7 x weekly - 3 sets - 10 reps  ASSESSMENT:  CLINICAL IMPRESSION: Patient arrived with some improvement in symptoms.  She will be scheduling surgery to remove the hemangioma.  We reviewed xrays and explained that her referring provider would go over these in detail but that her spacing issues are pretty severe and the PF area appeared fairly degenerative.  We worked heavily on quad rehab today.  We also added hip strengthening to help with alignment.  She has good body awareness and did most all activities with minimal to no pain today.  She would likely be able to increase resistance to 5 lbs on most quad exercises with exception of SLR as she has quite a bit of hip flexor weakness R > L.  She is well motivated and compliant.  Our goal is to give her as much relief as possible through strength and alignment to allow time for her to get through her issues with the brain tumor.  She would benefit from continued skilled PT for quad rehab, hip strengthening and modalities to control pain.    OBJECTIVE  IMPAIRMENTS: Abnormal gait, decreased balance, decreased mobility, difficulty walking, decreased ROM, decreased strength, increased fascial restrictions, increased muscle spasms, impaired flexibility, postural dysfunction, and pain.   ACTIVITY LIMITATIONS: carrying, lifting, bending, sitting, standing, squatting, sleeping, stairs, transfers, bathing, toileting, dressing, hygiene/grooming, and caring for others  PARTICIPATION LIMITATIONS: meal prep, cleaning, laundry, driving, shopping, community activity, yard work, and church  PERSONAL FACTORS: Age, Fitness, Time since onset of injury/illness/exacerbation, and 1-2 comorbidities: hx scarlett fever and brain tumor  are also affecting patient's functional outcome.   REHAB POTENTIAL: Good  CLINICAL DECISION MAKING: Stable/uncomplicated  EVALUATION COMPLEXITY: Low   GOALS: Goals reviewed with patient? Yes  SHORT TERM GOALS: Target date: 10/08/2023  Pain report to be no greater than 4/10  Baseline: Goal status: INITIAL  2.  Patient will be independent with initial HEP  Baseline:  Goal status: INITIAL  LONG TERM GOALS: Target date: 11/05/2023   Pain report to be no greater than 4/10  Baseline:  Goal status: INITIAL  2.  Patient to be independent with advanced HEP  Baseline:  Goal status: INITIAL  3.  Patient to be able to stand or walk for at least 15 min without leg pain  Baseline:  Goal status: INITIAL  4.  Patient to be able to ascend and descend steps without pain or no greater than 2/10  Baseline:  Goal status: INITIAL  5.  Patient to be able to bend, stoop and squat with pain no greater than 2/10  Baseline:  Goal status: INITIAL  6.  LEFS score to improve by 2-4 points Baseline:  Goal status: INITIAL   PLAN:  PT FREQUENCY: 2x/week  PT DURATION: 8 weeks  PLANNED INTERVENTIONS: 97110-Therapeutic exercises, 97530- Therapeutic activity, [id number]- Neuromuscular re-education, 97535- Self Care, 16109- Manual therapy,  [id number]- Gait  training, 16109- Canalith repositioning, 60454- Aquatic Therapy, [id number]- Splinting, 09811- Electrical stimulation (unattended), [id number]- Electrical stimulation (manual), [id number]- Vasopneumatic device, [id number]- Ultrasound, [id number]- Ionotophoresis 4mg /ml Dexamethasone, Patient/Family education, Balance training, Stair training, Taping, Dry Needling, Joint mobilization, Scar mobilization, Compression bandaging, Vestibular training, Visual/preceptual remediation/compensation, DME instructions, Cryotherapy, and Moist heat  PLAN FOR NEXT SESSION: Assess tolerance to exercises added to HEP, Nustep, increase resistance with all quad rehab, glut med strengthening, hip extension strength   Jourdyn Hasler B. Karmello Abercrombie, PT 09/24/23 11:27 AM [facility] [address] [place], [place] [address] Phone # [phone] Fax [phone]

## 2023-09-28 NOTE — Progress Notes (Signed)
 Radiation Oncology         [phone] ________________________________  Initial outpatient Consultation  Name: Annette Wilson MRN: [id number]  Date: 09/29/2023  DOB: October 07, 1945  WU:JWJX, Dorris Singh, MD  Dawley, Alan Mulder, DO   REFERRING PHYSICIAN: Dawley, Troy C, DO  DIAGNOSIS:  No diagnosis found.  Psammomatous meningioma (HCC); Left Brain Tumor  HISTORY OF PRESENT ILLNESS::Annette Wilson is a 78 y.o. female with a history of of a benign temporal lobe tumor diagnosed in February of 2020 following complains of recurrent spells of confusion, language dysfunction, and auditory hallucinations. 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 She underwent a left craniotomy and tumor resection on 06-02-22 under the care of Dr. Autumn Patty. Tumor was partially resected due to some border attachment to the brain tissue. As a result, patient underwent routine MRI to keep a close eye on tumor margins.   Subsequently, she presented for a MRI head on 08-22-23 which showed a mild growth of a left temporal meningioma just inferior to the craniectomy site when compared to 06/02/2022 with mass currently measuring 1.8 x 1 cm at the greatest extent compared to 1.5 x 0.6 cm previously. Scan also indicated regression of T2 hyperintensity in the subjacent encephalomalacic temporal lobe and more linear enhancement at and in the superficial left sylvian fissure could be tumor or scarring.   During most recent follow up with Dr. Kendell Bane Dawley on 09-09-23, she complained of left-sided intermittent numbness and tingling headaches. Patient was referred for consideration of radiation therapy to manage symptoms.    PREVIOUS RADIATION THERAPY: No  PAST MEDICAL HISTORY:  has a past medical history of Abscess (1954), Brain tumor (HCC) (09/2018), Duodenal ulcer (1961), Hepatitis (1951), and Scarlet fever.    PAST SURGICAL HISTORY: Past Surgical History:  Procedure Laterality Date   APPLICATION  OF CRANIAL NAVIGATION N/A 06/02/2022   Procedure: APPLICATION OF CRANIAL NAVIGATION;  Surgeon: Jadene Pierini, MD;  Location: [facility] OR;  Service: Neurosurgery;  Laterality: N/A;   CRANIOTOMY Left 06/02/2022   Procedure: Left craniotomy for tumor resection;  Surgeon: Jadene Pierini, MD;  Location: [facility] OR;  Service: Neurosurgery;  Laterality: Left;  RM 20   DIAGNOSTIC LAPAROSCOPY  1978   looking for fallopian tube blockage   HAND SURGERY Left    tumor removed between 1 & 2 nd digits   TONSILLECTOMY  1954    FAMILY HISTORY: family history includes Alzheimer's disease (age of onset: 33) in her mother; Lung cancer (age of onset: 77) in her father.  SOCIAL HISTORY:  reports that she has never smoked. She has never used smokeless tobacco. She reports that she does not currently use alcohol. She reports that she does not use drugs.  ALLERGIES: Alpha-gal, Aspirin, Cefadroxil, Cephalosporins, Codeine, and Penicillins  MEDICATIONS:  Current Outpatient Medications  Medication Sig Dispense Refill   clobetasol (OLUX) 0.05 % topical foam      EPINEPHrine 0.3 mg/0.3 mL IJ SOAJ injection Inject 0.3 mg into the muscle as needed for anaphylaxis.     estradiol (ESTRACE) 0.1 MG/GM vaginal cream Place 1 Applicatorful vaginally 2 (two) times a week.     ketoconazole (NIZORAL) 2 % shampoo SHAMPOO WITH SMALL AMOUNT TWICE WEEKLY     triamcinolone cream (KENALOG) 0.1 % Apply 1 a small amount to skin twice a day apply to active areas as needed     Vitamin D, Ergocalciferol, (DRISDOL) 1.25 MG (50000 UNIT) CAPS capsule Take 50,000 Units by mouth once a week.  No current facility-administered medications for this encounter.    REVIEW OF SYSTEMS:  As above.   PHYSICAL EXAM:  vitals were not taken for this visit.   General: Alert and oriented, in no acute distress *** HEENT: Head is normocephalic. Extraocular movements are intact. Oropharynx is clear. Neck: Neck is supple, no palpable cervical or  supraclavicular lymphadenopathy. Heart: Regular in rate and rhythm with no murmurs, rubs, or gallops. Chest: Clear to auscultation bilaterally, with no rhonchi, wheezes, or rales. Abdomen: Soft, nontender, nondistended, with no rigidity or guarding. Extremities: No cyanosis or edema. Lymphatics: see Neck Exam Skin: No concerning lesions. Musculoskeletal: symmetric strength and muscle tone throughout. Neurologic: Cranial nerves II through XII are grossly intact. No obvious focalities. Speech is fluent. Coordination is intact. Psychiatric: Judgment and insight are intact. Affect is appropriate.   LABORATORY DATA:  Lab Results  Component Value Date   WBC 4.3 06/02/2022   HGB 12.5 06/02/2022   HCT 38.6 06/02/2022   MCV 92.1 06/02/2022   PLT 177 06/02/2022   CMP     Component Value Date/Time   NA 139 06/02/2022 0829   K 3.6 06/02/2022 0829   CL 105 06/02/2022 0657   CO2 25 06/02/2022 0657   GLUCOSE 105 (H) 06/02/2022 0657   BUN 16 06/02/2022 0657   CREATININE 0.79 06/02/2022 0657   CALCIUM 8.9 06/02/2022 0657   GFRNONAA >60 06/02/2022 0657         RADIOGRAPHY: DG Knee AP/LAT W/Sunrise Left Result Date: 09/13/2023 CLINICAL DATA:  Bilateral knee pain for years. EXAM: LEFT KNEE 3 VIEWS; RIGHT KNEE 3 VIEWS COMPARISON:  None Available. FINDINGS: No evidence of fracture, dislocation, or joint effusion. Tricompartmental degenerative changes with osteophyte formation and joint space narrowing. Findings most severe of the medial compartments. IMPRESSION: Degenerative changes. Electronically Signed   By: Layla Maw M.D.   On: 09/13/2023 17:48   DG Knee AP/LAT W/Sunrise Right Result Date: 09/13/2023 CLINICAL DATA:  Bilateral knee pain for years. EXAM: LEFT KNEE 3 VIEWS; RIGHT KNEE 3 VIEWS COMPARISON:  None Available. FINDINGS: No evidence of fracture, dislocation, or joint effusion. Tricompartmental degenerative changes with osteophyte formation and joint space narrowing. Findings most  severe of the medial compartments. IMPRESSION: Degenerative changes. Electronically Signed   By: Layla Maw M.D.   On: 09/13/2023 17:48      IMPRESSION/PLAN: This is a very pleasant *** year old *** with metastatic disease to the brain.  I had a lengthy discussion with the patient after reviewing their MRI results with them.  We spoke about whole brain radiotherapy versus stereotactic radiosurgery to the brain. We spoke about the differing risks benefits and side effects of both of these treatments. During part of our discussion, we spoke about the hair loss, fatigue and cognitive effects that can result from whole brain radiotherapy.  Additionally, we spoke about radionecrosis that can result from stereotactic radiosurgery. I explained that whole brain radiotherapy is more comprehensive and therefore can decrease the chance of recurrences elsewhere in the brain, while stereotactic radiosurgery only treats the areas of gross disease while sparing the rest of the brain parenchyma.  After lengthy discussion, the patient would like to proceed with stereotactic brain radiosurgery to their metastatic disease. They will meet with neurosurgery in the near future to discuss this further; a neurosurgeon will participate in their case.  CT simulation will take place on *** and treatment on ***.  I plan to deliver *** Gy in 1 fraction to ***.  On date  of service, in total, I spent *** minutes on this encounter. Patient was seen in person.   __________________________________________   Lonie Peak, MD  This document serves as a record of services personally performed by Lonie Peak, MD. It was created on her behalf by Herbie Saxon, a trained medical scribe. The creation of this record is based on the scribe's personal observations and the provider's statements to them. This document has been checked and approved by the attending provider.

## 2023-09-29 ENCOUNTER — Ambulatory Visit
Admission: RE | Admit: 2023-09-29 | Discharge: 2023-09-29 | Disposition: A | Discharge status: Home / Self Care | Payer: Medicare Other | Source: Ambulatory Visit | Attending: Radiation Oncology | Admitting: Radiation Oncology

## 2023-09-29 ENCOUNTER — Other Ambulatory Visit: Payer: Self-pay | Admitting: Radiation Therapy

## 2023-09-29 VITALS — BP 143/61 | HR 59 | Temp 97.7°F | Resp 18 | Ht 63.5 in | Wt 150.6 lb

## 2023-09-29 DIAGNOSIS — Z801 Family history of malignant neoplasm of trachea, bronchus and lung: Secondary | ICD-10-CM | POA: Diagnosis not present

## 2023-09-29 DIAGNOSIS — D329 Benign neoplasm of meninges, unspecified: Secondary | ICD-10-CM | POA: Diagnosis not present

## 2023-09-29 DIAGNOSIS — D320 Benign neoplasm of cerebral meninges: Secondary | ICD-10-CM | POA: Insufficient documentation

## 2023-09-30 ENCOUNTER — Ambulatory Visit: Payer: Medicare Other | Attending: Family Medicine

## 2023-09-30 DIAGNOSIS — G8929 Other chronic pain: Secondary | ICD-10-CM | POA: Insufficient documentation

## 2023-09-30 DIAGNOSIS — R252 Cramp and spasm: Secondary | ICD-10-CM | POA: Diagnosis not present

## 2023-09-30 DIAGNOSIS — R293 Abnormal posture: Secondary | ICD-10-CM | POA: Diagnosis not present

## 2023-09-30 DIAGNOSIS — M25562 Pain in left knee: Secondary | ICD-10-CM | POA: Insufficient documentation

## 2023-09-30 DIAGNOSIS — M6281 Muscle weakness (generalized): Secondary | ICD-10-CM | POA: Insufficient documentation

## 2023-09-30 DIAGNOSIS — M25561 Pain in right knee: Secondary | ICD-10-CM | POA: Diagnosis not present

## 2023-09-30 DIAGNOSIS — R262 Difficulty in walking, not elsewhere classified: Secondary | ICD-10-CM | POA: Diagnosis not present

## 2023-09-30 NOTE — Therapy (Signed)
 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   Duodenal ulcer 1961   Hepatitis 1951   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   Past Surgical History:  Procedure Laterality Date   APPLICATION OF CRANIAL NAVIGATION N/A 06/02/2022   Procedure: APPLICATION OF CRANIAL NAVIGATION;  Surgeon: Jadene Pierini, MD;  Location: [facility] OR;  Service: Neurosurgery;  Laterality: N/A;   CRANIOTOMY Left 06/02/2022   Procedure: Left craniotomy for tumor resection;  Surgeon: Jadene Pierini, MD;  Location: [facility] OR;  Service: Neurosurgery;  Laterality: Left;  RM 20   DIAGNOSTIC LAPAROSCOPY  1978   looking for fallopian tube blockage   HAND SURGERY Left    tumor removed between 1 & 2 nd digits   TONSILLECTOMY  1954   Patient Active Problem List   Diagnosis Date Noted   Meningioma, recurrent of brain (HCC) 09/29/2023   Mild hyperlipidemia 09/01/2023   Bilateral primary osteoarthritis of knee 09/01/2023   Osteopenia 08/17/2022   Brain tumor (HCC) 06/02/2022    PCP: Thana Ates, MD  REFERRING PROVIDER: Rodolph Bong, MD  REFERRING DIAG:    M25.561,M25.562,G89.29 (ICD-10-CM) - Chronic pain of both knees  M81.0 (ICD-10-CM) - Age-related osteoporosis without current pathological fracture    THERAPY DIAG:  Chronic pain of right knee  Chronic pain of left knee  Muscle weakness (generalized)  Difficulty in walking, not elsewhere  classified  Cramp and spasm  Abnormal posture  Rationale for Evaluation and Treatment: Rehabilitation  ONSET DATE: 09/01/2023  SUBJECTIVE:   SUBJECTIVE STATEMENT: Patient reports she seems to be getting relief of her knee pain.  She states she has been very diligent with her HEP.       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  Removed one year ago but surgeon could not get all of tumor due to some border attachment to the brain tissue.  It is beginning to grow again and she will likely be having radiation vs surgery this time.   PAIN:  09/30/23 Are you having pain? Yes: NPRS scale: 0/10 at rest , 7/10 at worst Pain location: bilateral knees, also low back Pain description: aching Aggravating factors: start up, stairs Relieving factors: nothing  PRECAUTIONS: None  RED FLAGS: None   WEIGHT BEARING RESTRICTIONS: No  FALLS:  Has patient fallen in last 6 months? No  LIVING ENVIRONMENT: Lives with: lives with their family Lives in: House/apartment Stairs: Yes: Internal: 12 steps; on right going up and External: 2 steps; none Has following equipment at home: None  OCCUPATION: Desk job   PLOF: Independent, Independent with basic ADLs, Independent with household mobility without device, Independent with community mobility without device, Independent with homemaking with ambulation, Independent with gait, and Independent with transfers  PATIENT GOALS: To be able avoid losing more bone due to osteoporosis.  NEXT MD VISIT:  Evaluate and Treat for bilat knee pain and osteoporosis 1-2 times per week for 4-6 weeks.   Decrease pain, increase strength, flexibility, function, and range of motion.  Modalities may include, traction, ionto, phono, stim, and dry needling prn.  OBJECTIVE:  Note: Objective measures were completed at Evaluation unless otherwise noted.  DIAGNOSTIC FINDINGS: Images not read yet but images show pronounced varus bilaterally with limited  tibiofemoral space on both (Left >Right)  PATIENT SURVEYS:  LEFS 30/80  COGNITION: Overall cognitive status: Within functional limits for tasks assessed     SENSATION: WFL  POSTURE:  Pronounced varus deformity bilateral LE's Left > Right  PALPATION: Moderate global crepitus right knee, fairly isolated patellofemoral crepitus on left.   LOWER EXTREMITY ROM:  Lacking approx 10 degrees of full extension bilateral knees, otherwise ROM is [facility]  LOWER EXTREMITY MMT:  Hips generally 4/5 with exception of hip ER which was 4-/5 bilaterally,  hip extension 4-/5.    LOWER EXTREMITY SPECIAL TESTS:  Knee special tests: Apley's compression test: positive  and Patella tap test (ballotable patella): negative  FUNCTIONAL TESTS:  5 times sit to stand: 16.85 sec Timed up and go (TUG): 12.54 sec  GAIT: Distance walked: 30 feet Assistive device utilized: None Level of assistance: Modified independence Comments: slow, guarded, pronounced varus bilateral (Left > Right)                                                                                                                                TREATMENT DATE:  09/30/23 Nustep x 5 min level 3 (PT present to discuss status) Leg Press (seat 6) with 40 lbs x 20 Standing lateral band walks with yellow loop 3 laps at barre Standing hip extension at barre 2 x 10 each LE with 2# Seated LAQ with 2# 2 x 10 each LE Sit to stand 2 x 10 (verbal cues for alignment) Supine quad sets x 20 bilateral Supine TKE with small blue bolster with 2# 2 x 10 both Supine SAQ with larger blue bolster 2# 2 x 10 both SLR 2 x 10 each LE with 2# Hook lying clam shell with red loop 2 x 10 Side lying clam with yellow loop 2 x 10 each LE Discussed sleeping positions: lying on her left side seems to feel the best for her knee  09/24/23 Nustep x 5 min level 3 (PT present to discuss status) Seated LAQ with 2# 2 x 10 each LE Supine quad sets x 20 bilateral Supine TKE with  blue foam roller with 2# 2 x 10 both Supine SAQ with 2# 2 x 10 both SLR 2 x 10 each LE with 2# Hook lying clam shell with red loop 2 x 10 Side lying clam with yellow loop 2 x 10 each LE Standing lateral band walks with yellow loop 3 laps at back counter Standing hip extension at back counter 2 x 10 each LE Sit  to stand 2 x 10 (verbal cues for alignment- had patient sit in front of mirror)  09/10/23 Initial eval completed and initiated HEP    PATIENT EDUCATION:  Education details: Initiated HEP Person educated: Patient Education method: Programmer, multimedia, Demonstration, and Handouts Education comprehension: verbalized understanding, returned demonstration, and verbal cues required  HOME EXERCISE PROGRAM: Access Code: [email or web address] URL: [email or web address] Date: 09/24/2023 Prepared by: Mikey Kirschner  Exercises - Supine Quadricep Sets  - 1 x daily - 7 x weekly - 2 sets - 10 reps - Small Range Straight Leg Raise  - 1 x daily - 7 x weekly - 2 sets - 10 reps - Supine Knee Extension Strengthening  - 1 x daily - 7 x weekly - 2 sets - 10 reps - Seated Long Arc Quad  - 1 x daily - 7 x weekly - 2 sets - 10 reps - Sit to Stand Without Arm Support  - 1 x daily - 7 x weekly - 2 sets - 10 reps - Side Stepping with Resistance at Ankles and Counter Support  - 1 x daily - 7 x weekly - 3 sets - 10 reps  ASSESSMENT:  CLINICAL IMPRESSION: Donielle is progressing appropriately.  She is very compliant and well motivated.  She had some mild pain and reported mild crepitus on sit to stand on left knee but pain did not increase.  She seems to be progressing toward her goals with ease at this time.   She would benefit from continued skilled PT for quad rehab, hip strengthening and modalities to control pain.    OBJECTIVE IMPAIRMENTS: Abnormal gait, decreased balance, decreased mobility, difficulty walking, decreased ROM, decreased strength, increased fascial restrictions, increased muscle spasms,  impaired flexibility, postural dysfunction, and pain.   ACTIVITY LIMITATIONS: carrying, lifting, bending, sitting, standing, squatting, sleeping, stairs, transfers, bathing, toileting, dressing, hygiene/grooming, and caring for others  PARTICIPATION LIMITATIONS: meal prep, cleaning, laundry, driving, shopping, community activity, yard work, and church  PERSONAL FACTORS: Age, Fitness, Time since onset of injury/illness/exacerbation, and 1-2 comorbidities: hx scarlett fever and brain tumor  are also affecting patient's functional outcome.   REHAB POTENTIAL: Good  CLINICAL DECISION MAKING: Stable/uncomplicated  EVALUATION COMPLEXITY: Low   GOALS: Goals reviewed with patient? Yes  SHORT TERM GOALS: Target date: 10/08/2023  Pain report to be no greater than 4/10  Baseline: Goal status: In progress  2.  Patient will be independent with initial HEP  Baseline:  Goal status: In Progress  LONG TERM GOALS: Target date: 11/05/2023   Pain report to be no greater than 4/10  Baseline:  Goal status: INITIAL  2.  Patient to be independent with advanced HEP  Baseline:  Goal status: INITIAL  3.  Patient to be able to stand or walk for at least 15 min without leg pain  Baseline:  Goal status: INITIAL  4.  Patient to be able to ascend and descend steps without pain or no greater than 2/10  Baseline:  Goal status: INITIAL  5.  Patient to be able to bend, stoop and squat with pain no greater than 2/10  Baseline:  Goal status: INITIAL  6.  LEFS score to improve by 2-4 points Baseline:  Goal status: INITIAL   PLAN:  PT FREQUENCY: 2x/week  PT DURATION: 8 weeks  PLANNED INTERVENTIONS: 97110-Therapeutic exercises, 97530- Therapeutic activity, [id number]- Neuromuscular re-education, 97535- Self Care, 11914- Manual therapy, [id number]- Gait training, [phone]- Canalith repositioning, [id number]- Aquatic Therapy, 97760- Splinting, 97014- Electrical stimulation (unattended), [id number]- Electrical stimulation  (manual), [id number]-  Vasopneumatic device, [id number]- Ultrasound, [address]- Ionotophoresis 4mg /ml Dexamethasone, Patient/Family education, Balance training, Stair training, Taping, Dry Needling, Joint mobilization, Scar mobilization, Compression bandaging, Vestibular training, Visual/preceptual remediation/compensation, DME instructions, Cryotherapy, and Moist heat  PLAN FOR NEXT SESSION:  Nustep, increase resistance with all quad rehab, glut med strengthening, hip extension strength   Aicha Clingenpeel B. Fedor Kazmierski, PT 09/30/23 11:48 AM [facility] [address] [place], [place] [address] Phone # [phone] Fax [phone]

## 2023-10-01 ENCOUNTER — Telehealth: Payer: Self-pay | Admitting: Radiation Therapy

## 2023-10-01 DIAGNOSIS — L218 Other seborrheic dermatitis: Secondary | ICD-10-CM | POA: Diagnosis not present

## 2023-10-01 NOTE — Telephone Encounter (Signed)
 I called pt to introduce myself and discuss her upcomming radiation treatment planning appointments. We had a great conversation, her questions were answered and she expressed understanding and recall of what to expect.   Annette Wilson R.T.(R)(T) Radiation Special Procedures Lead

## 2023-10-02 ENCOUNTER — Ambulatory Visit: Payer: Self-pay

## 2023-10-02 DIAGNOSIS — G8929 Other chronic pain: Secondary | ICD-10-CM | POA: Diagnosis not present

## 2023-10-02 DIAGNOSIS — M25561 Pain in right knee: Secondary | ICD-10-CM | POA: Diagnosis not present

## 2023-10-02 DIAGNOSIS — M6281 Muscle weakness (generalized): Secondary | ICD-10-CM | POA: Diagnosis not present

## 2023-10-02 DIAGNOSIS — R262 Difficulty in walking, not elsewhere classified: Secondary | ICD-10-CM | POA: Diagnosis not present

## 2023-10-02 DIAGNOSIS — R293 Abnormal posture: Secondary | ICD-10-CM

## 2023-10-02 DIAGNOSIS — R252 Cramp and spasm: Secondary | ICD-10-CM

## 2023-10-02 DIAGNOSIS — M25562 Pain in left knee: Secondary | ICD-10-CM | POA: Diagnosis not present

## 2023-10-02 NOTE — Therapy (Signed)
 OUTPATIENT PHYSICAL THERAPY LOWER EXTREMITY EVALUATION   Patient Name: Annette Wilson MRN: [id number] DOB:27-Oct-1945, 78 y.o., female Today's Date: 10/02/2023  END OF SESSION:  PT End of Session - 10/02/23 0942     Visit Number 4    Date for PT Re-Evaluation 11/05/23    Authorization Type Medicare    PT Start Time 0932    PT Stop Time 1015    PT Time Calculation (min) 43 min    Activity Tolerance Patient tolerated treatment well    Behavior During Therapy [facility] for tasks assessed/performed             Past Medical History:  Diagnosis Date   Abscess 1954   developed abscesses after her tonsilectomy   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   Duodenal ulcer 1961   Hepatitis 1951   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   Past Surgical History:  Procedure Laterality Date   APPLICATION OF CRANIAL NAVIGATION N/A 06/02/2022   Procedure: APPLICATION OF CRANIAL NAVIGATION;  Surgeon: Jadene Pierini, MD;  Location: [facility] OR;  Service: Neurosurgery;  Laterality: N/A;   CRANIOTOMY Left 06/02/2022   Procedure: Left craniotomy for tumor resection;  Surgeon: Jadene Pierini, MD;  Location: [facility] OR;  Service: Neurosurgery;  Laterality: Left;  RM 20   DIAGNOSTIC LAPAROSCOPY  1978   looking for fallopian tube blockage   HAND SURGERY Left    tumor removed between 1 & 2 nd digits   TONSILLECTOMY  1954   Patient Active Problem List   Diagnosis Date Noted   Meningioma, recurrent of brain (HCC) 09/29/2023   Mild hyperlipidemia 09/01/2023   Bilateral primary osteoarthritis of knee 09/01/2023   Osteopenia 08/17/2022   Brain tumor (HCC) 06/02/2022    PCP: Thana Ates, MD  REFERRING PROVIDER: Rodolph Bong, MD  REFERRING DIAG:    M25.561,M25.562,G89.29 (ICD-10-CM) - Chronic pain of both knees  M81.0 (ICD-10-CM) - Age-related osteoporosis without current pathological fracture    THERAPY DIAG:  Chronic pain of right knee  Chronic pain of left knee  Muscle weakness (generalized)  Difficulty  in walking, not elsewhere classified  Cramp and spasm  Abnormal posture  Rationale for Evaluation and Treatment: Rehabilitation  ONSET DATE: 09/01/2023  SUBJECTIVE:   SUBJECTIVE STATEMENT: Patient reports she seems to be getting relief of her knee pain.  She states she has been very diligent with her HEP.       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  Removed one year ago but surgeon could not get all of tumor due to some border attachment to the brain tissue.  It is beginning to grow again and she will likely be having radiation vs surgery this time.   PAIN:  09/30/23 Are you having pain? Yes: NPRS scale: 0/10 at rest , 7/10 at worst Pain location: bilateral knees, also low back Pain description: aching Aggravating factors: start up, stairs Relieving factors: nothing  PRECAUTIONS: None  RED FLAGS: None   WEIGHT BEARING RESTRICTIONS: No  FALLS:  Has patient fallen in last 6 months? No  LIVING ENVIRONMENT: Lives with: lives with their family Lives in: House/apartment Stairs: Yes: Internal: 12 steps; on right going up and External: 2 steps; none Has following equipment at home: None  OCCUPATION: Desk job   PLOF: Independent, Independent with basic ADLs, Independent with household mobility without device, Independent with community mobility without device, Independent with homemaking with ambulation, Independent with gait, and Independent with transfers  PATIENT GOALS: To be  able avoid losing more bone due to osteoporosis.    NEXT MD VISIT:  Evaluate and Treat for bilat knee pain and osteoporosis 1-2 times per week for 4-6 weeks.   Decrease pain, increase strength, flexibility, function, and range of motion.  Modalities may include, traction, ionto, phono, stim, and dry needling prn.  OBJECTIVE:  Note: Objective measures were completed at Evaluation unless otherwise noted.  DIAGNOSTIC FINDINGS: Images not read yet but images show pronounced varus  bilaterally with limited tibiofemoral space on both (Left >Right)  PATIENT SURVEYS:  LEFS 30/80  COGNITION: Overall cognitive status: Within functional limits for tasks assessed     SENSATION: WFL  POSTURE:  Pronounced varus deformity bilateral LE's Left > Right  PALPATION: Moderate global crepitus right knee, fairly isolated patellofemoral crepitus on left.   LOWER EXTREMITY ROM:  Lacking approx 10 degrees of full extension bilateral knees, otherwise ROM is [facility]  LOWER EXTREMITY MMT:  Hips generally 4/5 with exception of hip ER which was 4-/5 bilaterally,  hip extension 4-/5.    LOWER EXTREMITY SPECIAL TESTS:  Knee special tests: Apley's compression test: positive  and Patella tap test (ballotable patella): negative  FUNCTIONAL TESTS:  5 times sit to stand: 16.85 sec Timed up and go (TUG): 12.54 sec  GAIT: Distance walked: 30 feet Assistive device utilized: None Level of assistance: Modified independence Comments: slow, guarded, pronounced varus bilateral (Left > Right)                                                                                                                                TREATMENT DATE:  09/30/23 Nustep x 5 min level 3 (PT present to discuss status) Leg Press (seat 6) with 40 lbs x 20 Standing heel raises x 20 Seated ball squeeze with purple ball x 20 Seated LAQ with 4# 2 x 10 with ball squeeze Seated clam with yellow loop x 20 Standing lateral band walks with yellow loop 3 laps at barre Step ups 2 x 10 on 6" Eccentric step down on 4" x 10 each LE Sit to stand x 10  Squat to chair x 10 Seated hamstring curl x 20 each with red tband Hip matrix abduction and extension 2 x 10 with 25#  09/24/23 Nustep x 5 min level 3 (PT present to discuss status) Seated LAQ with 2# 2 x 10 each LE Supine quad sets x 20 bilateral Supine TKE with blue foam roller with 2# 2 x 10 both Supine SAQ with 2# 2 x 10 both SLR 2 x 10 each LE with 2# Hook lying clam  shell with red loop 2 x 10 Side lying clam with yellow loop 2 x 10 each LE Standing lateral band walks with yellow loop 3 laps at back counter Standing hip extension at back counter 2 x 10 each LE Sit to stand 2 x 10 (verbal cues for alignment- had patient sit in front of mirror)  09/10/23  Initial eval completed and initiated HEP    PATIENT EDUCATION:  Education details: Initiated HEP Person educated: Patient Education method: Explanation, Facilities manager, and Handouts Education comprehension: verbalized understanding, returned demonstration, and verbal cues required  HOME EXERCISE PROGRAM: Access Code: [email or web address] URL: [email or web address] Date: 09/24/2023 Prepared by: Mikey Kirschner  Exercises - Supine Quadricep Sets  - 1 x daily - 7 x weekly - 2 sets - 10 reps - Small Range Straight Leg Raise  - 1 x daily - 7 x weekly - 2 sets - 10 reps - Supine Knee Extension Strengthening  - 1 x daily - 7 x weekly - 2 sets - 10 reps - Seated Long Arc Quad  - 1 x daily - 7 x weekly - 2 sets - 10 reps - Sit to Stand Without Arm Support  - 1 x daily - 7 x weekly - 2 sets - 10 reps - Side Stepping with Resistance at Ankles and Counter Support  - 1 x daily - 7 x weekly - 3 sets - 10 reps  ASSESSMENT:  CLINICAL IMPRESSION: Annette Wilson continues to tolerate quad rehab and functional strengthening with no significant issues.   She did have moderate pain with eccentric step down on 4" step but minimal to no pain on all other tasks.  She would benefit from continued skilled PT for quad rehab, hip strengthening and modalities to control pain.    OBJECTIVE IMPAIRMENTS: Abnormal gait, decreased balance, decreased mobility, difficulty walking, decreased ROM, decreased strength, increased fascial restrictions, increased muscle spasms, impaired flexibility, postural dysfunction, and pain.   ACTIVITY LIMITATIONS: carrying, lifting, bending, sitting, standing, squatting, sleeping, stairs, transfers,  bathing, toileting, dressing, hygiene/grooming, and caring for others  PARTICIPATION LIMITATIONS: meal prep, cleaning, laundry, driving, shopping, community activity, yard work, and church  PERSONAL FACTORS: Age, Fitness, Time since onset of injury/illness/exacerbation, and 1-2 comorbidities: hx scarlett fever and brain tumor  are also affecting patient's functional outcome.   REHAB POTENTIAL: Good  CLINICAL DECISION MAKING: Stable/uncomplicated  EVALUATION COMPLEXITY: Low   GOALS: Goals reviewed with patient? Yes  SHORT TERM GOALS: Target date: 10/08/2023  Pain report to be no greater than 4/10  Baseline: Goal status: In progress  2.  Patient will be independent with initial HEP  Baseline:  Goal status: In Progress  LONG TERM GOALS: Target date: 11/05/2023   Pain report to be no greater than 4/10  Baseline:  Goal status: INITIAL  2.  Patient to be independent with advanced HEP  Baseline:  Goal status: INITIAL  3.  Patient to be able to stand or walk for at least 15 min without leg pain  Baseline:  Goal status: INITIAL  4.  Patient to be able to ascend and descend steps without pain or no greater than 2/10  Baseline:  Goal status: INITIAL  5.  Patient to be able to bend, stoop and squat with pain no greater than 2/10  Baseline:  Goal status: INITIAL  6.  LEFS score to improve by 2-4 points Baseline:  Goal status: INITIAL   PLAN:  PT FREQUENCY: 2x/week  PT DURATION: 8 weeks  PLANNED INTERVENTIONS: 97110-Therapeutic exercises, 97530- Therapeutic activity, [id number]- Neuromuscular re-education, 97535- Self Care, 52841- Manual therapy, [id number]- Gait training, [phone]- Canalith repositioning, [id number]- Aquatic Therapy, 97760- Splinting, 97014- Electrical stimulation (unattended), [id number]- Electrical stimulation (manual), [id number]- Vasopneumatic device, [id number]- Ultrasound, [id number]- Ionotophoresis 4mg /ml Dexamethasone, Patient/Family education, Balance training, Stair training,  Taping, Dry Needling, Joint mobilization, Scar mobilization, Compression bandaging, Vestibular training, Visual/preceptual remediation/compensation, DME instructions, Cryotherapy, and  Moist heat  PLAN FOR NEXT SESSION:  Nustep, eccentric step downs with 4" to toleranc, increase resistance with all quad rehab, glut med strengthening, hip extension strength   Annette Wilson, PT 10/02/23 10:17 AM [facility] [address], [address] [place], [place] [address] Phone # [phone] Fax [phone]

## 2023-10-06 ENCOUNTER — Ambulatory Visit: Payer: Self-pay

## 2023-10-06 DIAGNOSIS — M25562 Pain in left knee: Secondary | ICD-10-CM | POA: Diagnosis not present

## 2023-10-06 DIAGNOSIS — R262 Difficulty in walking, not elsewhere classified: Secondary | ICD-10-CM

## 2023-10-06 DIAGNOSIS — M6281 Muscle weakness (generalized): Secondary | ICD-10-CM | POA: Diagnosis not present

## 2023-10-06 DIAGNOSIS — R252 Cramp and spasm: Secondary | ICD-10-CM

## 2023-10-06 DIAGNOSIS — G8929 Other chronic pain: Secondary | ICD-10-CM | POA: Diagnosis not present

## 2023-10-06 DIAGNOSIS — R293 Abnormal posture: Secondary | ICD-10-CM

## 2023-10-06 DIAGNOSIS — M25561 Pain in right knee: Secondary | ICD-10-CM | POA: Diagnosis not present

## 2023-10-06 NOTE — Therapy (Signed)
 OUTPATIENT PHYSICAL THERAPY LOWER EXTREMITY TREATMENT   Patient Name: Annette Wilson MRN: [id number] DOB:[birth year], 78 y.o., female Today's Date: 10/06/2023  END OF SESSION:  PT End of Session - 10/06/23 1232     Visit Number 5    Date for PT Re-Evaluation 11/05/23    Authorization Type Medicare    PT Start Time 1232    PT Stop Time 1315    PT Time Calculation (min) 43 min    Activity Tolerance Patient tolerated treatment well    Behavior During Therapy [facility] for tasks assessed/performed             Past Medical History:  Diagnosis Date   Abscess 1954   developed abscesses after her tonsilectomy   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   Duodenal ulcer 1961   Hepatitis 1951   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   Past Surgical History:  Procedure Laterality Date   APPLICATION OF CRANIAL NAVIGATION N/A 06/02/2022   Procedure: APPLICATION OF CRANIAL NAVIGATION;  Surgeon: Jadene Pierini, MD;  Location: [facility] OR;  Service: Neurosurgery;  Laterality: N/A;   CRANIOTOMY Left 06/02/2022   Procedure: Left craniotomy for tumor resection;  Surgeon: Jadene Pierini, MD;  Location: [facility] OR;  Service: Neurosurgery;  Laterality: Left;  RM 20   DIAGNOSTIC LAPAROSCOPY  1978   looking for fallopian tube blockage   HAND SURGERY Left    tumor removed between 1 & 2 nd digits   TONSILLECTOMY  1954   Patient Active Problem List   Diagnosis Date Noted   Meningioma, recurrent of brain (HCC) 09/29/2023   Mild hyperlipidemia 09/01/2023   Bilateral primary osteoarthritis of knee 09/01/2023   Osteopenia 08/17/2022   Brain tumor (HCC) 06/02/2022    PCP: Thana Ates, MD  REFERRING PROVIDER: Rodolph Bong, MD  REFERRING DIAG:    M25.561,M25.562,G89.29 (ICD-10-CM) - Chronic pain of both knees  M81.0 (ICD-10-CM) - Age-related osteoporosis without current pathological fracture    THERAPY DIAG:  Chronic pain of left knee  Muscle weakness (generalized)  Difficulty in walking, not elsewhere  classified  Cramp and spasm  Chronic pain of right knee  Abnormal posture  Rationale for Evaluation and Treatment: Rehabilitation  ONSET DATE: 09/01/2023  SUBJECTIVE:   SUBJECTIVE STATEMENT: Patient reports she seems to be getting relief of her knee pain.  She states she has been very diligent with her HEP.       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  Removed one year ago but surgeon could not get all of tumor due to some border attachment to the brain tissue.  It is beginning to grow again and she will likely be having radiation vs surgery this time.   PAIN:  09/30/23 Are you having pain? Yes: NPRS scale: 0/10 at rest , 7/10 at worst Pain location: bilateral knees, also low back Pain description: aching Aggravating factors: start up, stairs Relieving factors: nothing  PRECAUTIONS: None  RED FLAGS: None   WEIGHT BEARING RESTRICTIONS: No  FALLS:  Has patient fallen in last 6 months? No  LIVING ENVIRONMENT: Lives with: lives with their family Lives in: House/apartment Stairs: Yes: Internal: 12 steps; on right going up and External: 2 steps; none Has following equipment at home: None  OCCUPATION: Desk job   PLOF: Independent, Independent with basic ADLs, Independent with household mobility without device, Independent with community mobility without device, Independent with homemaking with ambulation, Independent with gait, and Independent with transfers  PATIENT GOALS: To be  able avoid losing more bone due to osteoporosis.    NEXT MD VISIT:  Evaluate and Treat for bilat knee pain and osteoporosis 1-2 times per week for 4-6 weeks.   Decrease pain, increase strength, flexibility, function, and range of motion.  Modalities may include, traction, ionto, phono, stim, and dry needling prn.  OBJECTIVE:  Note: Objective measures were completed at Evaluation unless otherwise noted.  DIAGNOSTIC FINDINGS: Images not read yet but images show pronounced varus  bilaterally with limited tibiofemoral space on both (Left >Right)  PATIENT SURVEYS:  LEFS 30/80  COGNITION: Overall cognitive status: Within functional limits for tasks assessed     SENSATION: WFL  POSTURE:  Pronounced varus deformity bilateral LE's Left > Right  PALPATION: Moderate global crepitus right knee, fairly isolated patellofemoral crepitus on left.   LOWER EXTREMITY ROM:  Lacking approx 10 degrees of full extension bilateral knees, otherwise ROM is [facility]  LOWER EXTREMITY MMT:  Hips generally 4/5 with exception of hip ER which was 4-/5 bilaterally,  hip extension 4-/5.    LOWER EXTREMITY SPECIAL TESTS:  Knee special tests: Apley's compression test: positive  and Patella tap test (ballotable patella): negative  FUNCTIONAL TESTS:  5 times sit to stand: 16.85 sec Timed up and go (TUG): 12.54 sec  GAIT: Distance walked: 30 feet Assistive device utilized: None Level of assistance: Modified independence Comments: slow, guarded, pronounced varus bilateral (Left > Right)                                                                                                                                TREATMENT DATE:  10/06/23 Nustep x 5 min level 5 (PT present to discuss status) Leg Press (seat 6) with 40 lbs x 20 Seated ball squeeze with purple ball x 20 Seated LAQ with 4# 2 x 10 with ball squeeze Seated clam with yellow loop x 20 Sit to stand x 10  Squat to chair x 10 Seated hamstring curl x 20 each with red tband Standing heel raises x 20 Standing lateral band walks with yellow loop 5 laps at barre Step ups 2 x 10 on 6" Eccentric step down on 4" x 10 each LE Hip matrix abduction and extension 2 x 10 with 25#  09/30/23 Nustep x 5 min level 3 (PT present to discuss status) Leg Press (seat 6) with 40 lbs x 20 Standing heel raises x 20 Seated ball squeeze with purple ball x 20 Seated LAQ with 4# 2 x 10 with ball squeeze Seated clam with yellow loop x 20 Standing  lateral band walks with yellow loop 3 laps at barre Step ups 2 x 10 on 6" Eccentric step down on 4" x 10 each LE Sit to stand x 10  Squat to chair x 10 Seated hamstring curl x 20 each with red tband Hip matrix abduction and extension 2 x 10 with 25#  09/24/23 Nustep x 5 min level 3 (PT present  to discuss status) Seated LAQ with 2# 2 x 10 each LE Supine quad sets x 20 bilateral Supine TKE with blue foam roller with 2# 2 x 10 both Supine SAQ with 2# 2 x 10 both SLR 2 x 10 each LE with 2# Hook lying clam shell with red loop 2 x 10 Side lying clam with yellow loop 2 x 10 each LE Standing lateral band walks with yellow loop 3 laps at back counter Standing hip extension at back counter 2 x 10 each LE Sit to stand 2 x 10 (verbal cues for alignment- had patient sit in front of mirror)  09/10/23 Initial eval completed and initiated HEP    PATIENT EDUCATION:  Education details: Initiated HEP Person educated: Patient Education method: Programmer, multimedia, Facilities manager, and Handouts Education comprehension: verbalized understanding, returned demonstration, and verbal cues required  HOME EXERCISE PROGRAM: Access Code: [email or web address] URL: [email or web address] Date: 09/24/2023 Prepared by: Mikey Kirschner  Exercises - Supine Quadricep Sets  - 1 x daily - 7 x weekly - 2 sets - 10 reps - Small Range Straight Leg Raise  - 1 x daily - 7 x weekly - 2 sets - 10 reps - Supine Knee Extension Strengthening  - 1 x daily - 7 x weekly - 2 sets - 10 reps - Seated Long Arc Quad  - 1 x daily - 7 x weekly - 2 sets - 10 reps - Sit to Stand Without Arm Support  - 1 x daily - 7 x weekly - 2 sets - 10 reps - Side Stepping with Resistance at Ankles and Counter Support  - 1 x daily - 7 x weekly - 3 sets - 10 reps  ASSESSMENT:  CLINICAL IMPRESSION: Anaise is progressing appropriately.  She is very diligent with her HEP.  She has been able to do reciprocal gait on ascending steps at home.  She continues to  use a side step approach for descending steps.  She has 2 steps going into the house that she uses frequently.  These steps do not have railings.  Suggested placing handrails preventatively due to the severity of her knee issues.   She would benefit from continued skilled PT for quad rehab, hip strengthening and modalities to control pain.    OBJECTIVE IMPAIRMENTS: Abnormal gait, decreased balance, decreased mobility, difficulty walking, decreased ROM, decreased strength, increased fascial restrictions, increased muscle spasms, impaired flexibility, postural dysfunction, and pain.   ACTIVITY LIMITATIONS: carrying, lifting, bending, sitting, standing, squatting, sleeping, stairs, transfers, bathing, toileting, dressing, hygiene/grooming, and caring for others  PARTICIPATION LIMITATIONS: meal prep, cleaning, laundry, driving, shopping, community activity, yard work, and church  PERSONAL FACTORS: Age, Fitness, Time since onset of injury/illness/exacerbation, and 1-2 comorbidities: hx scarlett fever and brain tumor  are also affecting patient's functional outcome.   REHAB POTENTIAL: Good  CLINICAL DECISION MAKING: Stable/uncomplicated  EVALUATION COMPLEXITY: Low   GOALS: Goals reviewed with patient? Yes  SHORT TERM GOALS: Target date: 10/08/2023  Pain report to be no greater than 4/10  Baseline: Goal status: MET 10/06/23  2.  Patient will be independent with initial HEP  Baseline:  Goal status: MET 10/06/23  LONG TERM GOALS: Target date: 11/05/2023   Pain report to be no greater than 4/10  Baseline:  Goal status: INITIAL  2.  Patient to be independent with advanced HEP  Baseline:  Goal status: INITIAL  3.  Patient to be able to stand or walk for at least 15 min without leg pain  Baseline:  Goal status: INITIAL  4.  Patient to be able to ascend and descend steps without pain or no greater than 2/10  Baseline:  Goal status: INITIAL  5.  Patient to be able to bend, stoop and  squat with pain no greater than 2/10  Baseline:  Goal status: INITIAL  6.  LEFS score to improve by 2-4 points Baseline:  Goal status: INITIAL   PLAN:  PT FREQUENCY: 2x/week  PT DURATION: 8 weeks  PLANNED INTERVENTIONS: 97110-Therapeutic exercises, 97530- Therapeutic activity, [id number]- Neuromuscular re-education, 97535- Self Care, 82956- Manual therapy, [id number]- Gait training, [phone]- Canalith repositioning, [id number]- Aquatic Therapy, 97760- Splinting, 97014- Electrical stimulation (unattended), [id number]- Electrical stimulation (manual), 97016- Vasopneumatic device, [id number]- Ultrasound, 65784- Ionotophoresis 4mg /ml Dexamethasone, Patient/Family education, Balance training, Stair training, Taping, Dry Needling, Joint mobilization, Scar mobilization, Compression bandaging, Vestibular training, Visual/preceptual remediation/compensation, DME instructions, Cryotherapy, and Moist heat  PLAN FOR NEXT SESSION:  Nustep, functional strengthening, eccentric step downs with 4" to tolerance, increase resistance with all quad rehab, glut med strengthening, hip extension strength   Blu Mcglaun B. Neev Mcmains, PT 10/06/23 9:18 PM [facility] [address], [address] [place], [place] [address] Phone # [phone] Fax [phone]

## 2023-10-07 ENCOUNTER — Ambulatory Visit: Payer: Medicare Other

## 2023-10-07 DIAGNOSIS — G8929 Other chronic pain: Secondary | ICD-10-CM

## 2023-10-07 DIAGNOSIS — M6281 Muscle weakness (generalized): Secondary | ICD-10-CM | POA: Diagnosis not present

## 2023-10-07 DIAGNOSIS — R252 Cramp and spasm: Secondary | ICD-10-CM

## 2023-10-07 DIAGNOSIS — R262 Difficulty in walking, not elsewhere classified: Secondary | ICD-10-CM | POA: Diagnosis not present

## 2023-10-07 DIAGNOSIS — R293 Abnormal posture: Secondary | ICD-10-CM

## 2023-10-07 DIAGNOSIS — M25562 Pain in left knee: Secondary | ICD-10-CM | POA: Diagnosis not present

## 2023-10-07 DIAGNOSIS — M25561 Pain in right knee: Secondary | ICD-10-CM | POA: Diagnosis not present

## 2023-10-07 NOTE — Therapy (Signed)
 OUTPATIENT PHYSICAL THERAPY LOWER EXTREMITY TREATMENT   Patient Name: Annette Wilson MRN: [id number] DOB:1946/05/13, 78 y.o., female Today's Date: 10/07/2023  END OF SESSION:  PT End of Session - 10/07/23 1105     Visit Number 6    Date for PT Re-Evaluation 11/05/23    Authorization Type Medicare    PT Start Time 1102    PT Stop Time 1142    PT Time Calculation (min) 40 min    Activity Tolerance Patient tolerated treatment well    Behavior During Therapy [facility] for tasks assessed/performed             Past Medical History:  Diagnosis Date   Abscess 1954   developed abscesses after her tonsilectomy   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   Duodenal ulcer 1961   Hepatitis 1951   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   Past Surgical History:  Procedure Laterality Date   APPLICATION OF CRANIAL NAVIGATION N/A 06/02/2022   Procedure: APPLICATION OF CRANIAL NAVIGATION;  Surgeon: Jadene Pierini, MD;  Location: [facility] OR;  Service: Neurosurgery;  Laterality: N/A;   CRANIOTOMY Left 06/02/2022   Procedure: Left craniotomy for tumor resection;  Surgeon: Jadene Pierini, MD;  Location: [facility] OR;  Service: Neurosurgery;  Laterality: Left;  RM 20   DIAGNOSTIC LAPAROSCOPY  1978   looking for fallopian tube blockage   HAND SURGERY Left    tumor removed between 1 & 2 nd digits   TONSILLECTOMY  1954   Patient Active Problem List   Diagnosis Date Noted   Meningioma, recurrent of brain (HCC) 09/29/2023   Mild hyperlipidemia 09/01/2023   Bilateral primary osteoarthritis of knee 09/01/2023   Osteopenia 08/17/2022   Brain tumor (HCC) 06/02/2022    PCP: Thana Ates, MD  REFERRING PROVIDER: Rodolph Bong, MD  REFERRING DIAG:    M25.561,M25.562,G89.29 (ICD-10-CM) - Chronic pain of both knees  M81.0 (ICD-10-CM) - Age-related osteoporosis without current pathological fracture    THERAPY DIAG:  Chronic pain of right knee  Muscle weakness (generalized)  Difficulty in walking, not elsewhere  classified  Cramp and spasm  Chronic pain of left knee  Abnormal posture  Rationale for Evaluation and Treatment: Rehabilitation  ONSET DATE: 09/01/2023  SUBJECTIVE:   SUBJECTIVE STATEMENT: Patient reports she is still getting moments of the sharp pain but this is less frequent and she feels stronger.   She reports improved confidence with walking and stairs.       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  Removed one year ago but surgeon could not get all of tumor due to some border attachment to the brain tissue.  It is beginning to grow again and she will likely be having radiation vs surgery this time.   PAIN:  10/07/23 (the 7/10 is happening less frequently, its only when I turn a certain way) Are you having pain? Yes: NPRS scale: 0/10 at rest , 7/10 at worst Pain location: bilateral knees, also low back Pain description: aching Aggravating factors: start up, stairs Relieving factors: nothing  PRECAUTIONS: None  RED FLAGS: None   WEIGHT BEARING RESTRICTIONS: No  FALLS:  Has patient fallen in last 6 months? No  LIVING ENVIRONMENT: Lives with: lives with their family Lives in: House/apartment Stairs: Yes: Internal: 12 steps; on right going up and External: 2 steps; none Has following equipment at home: None  OCCUPATION: Desk job   PLOF: Independent, Independent with basic ADLs, Independent with household mobility without device, Independent with  community mobility without device, Independent with homemaking with ambulation, Independent with gait, and Independent with transfers  PATIENT GOALS: To be able avoid losing more bone due to osteoporosis.    NEXT MD VISIT:  Evaluate and Treat for bilat knee pain and osteoporosis 1-2 times per week for 4-6 weeks.   Decrease pain, increase strength, flexibility, function, and range of motion.  Modalities may include, traction, ionto, phono, stim, and dry needling prn.  OBJECTIVE:  Note: Objective measures  were completed at Evaluation unless otherwise noted.  DIAGNOSTIC FINDINGS: Images not read yet but images show pronounced varus bilaterally with limited tibiofemoral space on both (Left >Right)  PATIENT SURVEYS:  LEFS 30/80  COGNITION: Overall cognitive status: Within functional limits for tasks assessed     SENSATION: WFL  POSTURE:  Pronounced varus deformity bilateral LE's Left > Right  PALPATION: Moderate global crepitus right knee, fairly isolated patellofemoral crepitus on left.   LOWER EXTREMITY ROM:  Lacking approx 10 degrees of full extension bilateral knees, otherwise ROM is [facility]  LOWER EXTREMITY MMT:  Hips generally 4/5 with exception of hip ER which was 4-/5 bilaterally,  hip extension 4-/5.    LOWER EXTREMITY SPECIAL TESTS:  Knee special tests: Apley's compression test: positive  and Patella tap test (ballotable patella): negative  FUNCTIONAL TESTS:  5 times sit to stand: 16.85 sec Timed up and go (TUG): 12.54 sec  GAIT: Distance walked: 30 feet Assistive device utilized: None Level of assistance: Modified independence Comments: slow, guarded, pronounced varus bilateral (Left > Right)                                                                                                                                TREATMENT DATE:  10/07/23 Nustep x 5 min level 5 (PT present to discuss status) Leg Press (seat 6) with 45 lbs x 20 Seated ball squeeze with purple ball x 20 Seated LAQ with 4# 2 x 10 with ball squeeze Seated clam with red loop x 20 Sit to stand x 10 with 5 lb kb Squat to chair x 10 with 5 lb kb Seated hamstring curl x 20 each with red tband Standing heel raises x 20 Standing lateral band walks with yellow loop 5 laps at barre SLS cone touches 3 x 10 each LE (cone on 8" step on its tall side with balance pad on top of that) Step ups 2 x 10 on 6" Eccentric step down on 4" x 10 each LE (attempted lateral heel tap today but increased PF pain so  continue with step downs) Hip matrix abduction and extension 2 x 10 with 25#  10/06/23 Nustep x 5 min level 5 (PT present to discuss status) Leg Press (seat 6) with 40 lbs x 20 Seated ball squeeze with purple ball x 20 Seated LAQ with 4# 2 x 10 with ball squeeze Seated clam with yellow loop x 20 Sit to stand x 10  Squat  to chair x 10 Seated hamstring curl x 20 each with red tband Standing heel raises x 20 Standing lateral band walks with yellow loop 5 laps at barre Step ups 2 x 10 on 6" Eccentric step down on 4" x 10 each LE Hip matrix abduction and extension 2 x 10 with 25#  09/30/23 Nustep x 5 min level 3 (PT present to discuss status) Leg Press (seat 6) with 40 lbs x 20 Standing heel raises x 20 Seated ball squeeze with purple ball x 20 Seated LAQ with 4# 2 x 10 with ball squeeze Seated clam with yellow loop x 20 Standing lateral band walks with yellow loop 3 laps at barre Step ups 2 x 10 on 6" Eccentric step down on 4" x 10 each LE Sit to stand x 10  Squat to chair x 10 Seated hamstring curl x 20 each with red tband Hip matrix abduction and extension 2 x 10 with 25#  09/10/23 Initial eval completed and initiated HEP    PATIENT EDUCATION:  Education details: Initiated HEP Person educated: Patient Education method: Programmer, multimedia, Facilities manager, and Handouts Education comprehension: verbalized understanding, returned demonstration, and verbal cues required  HOME EXERCISE PROGRAM: Access Code: [email or web address] URL: [email or web address] Date: 09/24/2023 Prepared by: Mikey Kirschner  Exercises - Supine Quadricep Sets  - 1 x daily - 7 x weekly - 2 sets - 10 reps - Small Range Straight Leg Raise  - 1 x daily - 7 x weekly - 2 sets - 10 reps - Supine Knee Extension Strengthening  - 1 x daily - 7 x weekly - 2 sets - 10 reps - Seated Long Arc Quad  - 1 x daily - 7 x weekly - 2 sets - 10 reps - Sit to Stand Without Arm Support  - 1 x daily - 7 x weekly - 2 sets - 10  reps - Side Stepping with Resistance at Ankles and Counter Support  - 1 x daily - 7 x weekly - 3 sets - 10 reps  ASSESSMENT:  CLINICAL IMPRESSION: Lochlyn was able to tolerate increased resistance with squats and sit to stand.  We also initiated proprioception tasks such as cone touches to encourage use of all balance strategies to reduce shearing force on knees.  She is pain free most of the time but does still get a sharp 7/10 pain with rotational moves.   We attempted eccentric heel taps vs step downs today.  This is the first episode we have seen her having some PF shearing pain.  We discontinued this and resumed the eccentric step down instead.  Offered ice post session but patient declined.  Suggested that she ice at home if she experiences any increased or new anterior knee pain. She would benefit from continued skilled PT for quad rehab, hip strengthening and modalities to control pain.    OBJECTIVE IMPAIRMENTS: Abnormal gait, decreased balance, decreased mobility, difficulty walking, decreased ROM, decreased strength, increased fascial restrictions, increased muscle spasms, impaired flexibility, postural dysfunction, and pain.   ACTIVITY LIMITATIONS: carrying, lifting, bending, sitting, standing, squatting, sleeping, stairs, transfers, bathing, toileting, dressing, hygiene/grooming, and caring for others  PARTICIPATION LIMITATIONS: meal prep, cleaning, laundry, driving, shopping, community activity, yard work, and church  PERSONAL FACTORS: Age, Fitness, Time since onset of injury/illness/exacerbation, and 1-2 comorbidities: hx scarlett fever and brain tumor  are also affecting patient's functional outcome.   REHAB POTENTIAL: Good  CLINICAL DECISION MAKING: Stable/uncomplicated  EVALUATION COMPLEXITY: Low   GOALS: Goals reviewed with patient? Yes  SHORT TERM GOALS: Target date: 10/08/2023  Pain report to be no greater than 4/10  Baseline: Goal status: MET 10/06/23  2.  Patient  will be independent with initial HEP  Baseline:  Goal status: MET 10/06/23  LONG TERM GOALS: Target date: 11/05/2023   Pain report to be no greater than 4/10  Baseline:  Goal status: MET Partially  2.  Patient to be independent with advanced HEP  Baseline:  Goal status: INITIAL  3.  Patient to be able to stand or walk for at least 15 min without leg pain  Baseline:  Goal status: INITIAL  4.  Patient to be able to ascend and descend steps without pain or no greater than 2/10  Baseline:  Goal status: INITIAL  5.  Patient to be able to bend, stoop and squat with pain no greater than 2/10  Baseline:  Goal status: INITIAL  6.  LEFS score to improve by 2-4 points Baseline:  Goal status: INITIAL   PLAN:  PT FREQUENCY: 2x/week  PT DURATION: 8 weeks  PLANNED INTERVENTIONS: 97110-Therapeutic exercises, 97530- Therapeutic activity, [id number]- Neuromuscular re-education, 97535- Self Care, 16109- Manual therapy, [id number]- Gait training, [phone]- Canalith repositioning, [id number]- Aquatic Therapy, 97760- Splinting, 97014- Electrical stimulation (unattended), [id number]- Electrical stimulation (manual), 97016- Vasopneumatic device, [id number]- Ultrasound, [id number]- Ionotophoresis 4mg /ml Dexamethasone, Patient/Family education, Balance training, Stair training, Taping, Dry Needling, Joint mobilization, Scar mobilization, Compression bandaging, Vestibular training, Visual/preceptual remediation/compensation, DME instructions, Cryotherapy, and Moist heat  PLAN FOR NEXT SESSION:  Nustep, functional strengthening, eccentric step downs with 4" to tolerance, increase resistance with all quad rehab, glut med strengthening, hip extension strength   Aiden Rao B. Lloyd Cullinan, PT 10/07/23 11:44 AM [facility] [address] [place], [place] [address] Phone # [phone] Fax [phone]

## 2023-10-13 ENCOUNTER — Other Ambulatory Visit: Payer: Self-pay

## 2023-10-13 ENCOUNTER — Ambulatory Visit (INDEPENDENT_AMBULATORY_CARE_PROVIDER_SITE_OTHER): Payer: Medicare Other | Admitting: Family Medicine

## 2023-10-13 ENCOUNTER — Ambulatory Visit: Payer: Medicare Other

## 2023-10-13 VITALS — BP 142/76 | HR 55

## 2023-10-13 DIAGNOSIS — D320 Benign neoplasm of cerebral meninges: Secondary | ICD-10-CM

## 2023-10-13 DIAGNOSIS — M25561 Pain in right knee: Secondary | ICD-10-CM | POA: Diagnosis not present

## 2023-10-13 DIAGNOSIS — M170 Bilateral primary osteoarthritis of knee: Secondary | ICD-10-CM | POA: Diagnosis not present

## 2023-10-13 DIAGNOSIS — G8929 Other chronic pain: Secondary | ICD-10-CM | POA: Diagnosis not present

## 2023-10-13 DIAGNOSIS — M25562 Pain in left knee: Secondary | ICD-10-CM

## 2023-10-13 NOTE — Progress Notes (Signed)
   Rubin Payor, PhD, LAT, ATC acting as a scribe for Annette Graham, MD.  Annette Wilson is a 78 y.o. female who presents to [facility] today for f/u bilat knee OA and osteopenia. Pt was last seen by Dr. Denyse Amass on 09/01/23 and labs were checked and was referred to PT, completing 6 visits.  Today, pt reports she feels some improvement w/ her knee pain after PT, but they are still bothersome, L>R. She is also wanting to f/u on her osteoporosis treatment plan  Since I saw her last her meningioma has regrown.  She had a left temporal meningioma excision in 2023 and on subsequent repeat MRI has returned.  She has been seen by radiation oncology and is scheduled for gamma knife surgery next month.  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   Exam:  BP (!) 142/76   Pulse (!) 55   SpO2 98%  General: Well Developed, well nourished, and in no acute distress.   MSK: Knees bilaterally mild genu varus. Normal knee motion.. Some laxity to MCL testing is present. Intact strength.     Assessment and Plan: 78 y.o. female with chronic left knee pain due to medial compartment DJD severe. We talked about options.  She has had a fair amount of benefit with physical therapy which will end soon.  Her focus of her health over the next month is going to be this meningioma and her upcoming radiation oncology treatment.  Further PT in the future may be helpful as well potential steroid injections.  She will benefit potentially from medial off loader knee brace to help with knee pain and instability. She will check back with me as needed for this.  Happy to do steroid injection anytime or get her set up with his knee braces.   PDMP not reviewed this encounter. No orders of the defined types were placed in  this encounter.  No orders of the defined types were placed in this encounter.    Discussed warning signs or symptoms. Please see discharge instructions. Patient expresses understanding.   The above documentation has been reviewed and is accurate and complete Annette Wilson, M.D. Total encounter time 30 minutes including face-to-face time with the patient and, reviewing past medical record, and charting on the date of service.

## 2023-10-15 ENCOUNTER — Ambulatory Visit: Payer: Medicare Other

## 2023-10-15 DIAGNOSIS — R262 Difficulty in walking, not elsewhere classified: Secondary | ICD-10-CM

## 2023-10-15 DIAGNOSIS — M25562 Pain in left knee: Secondary | ICD-10-CM | POA: Diagnosis not present

## 2023-10-15 DIAGNOSIS — G8929 Other chronic pain: Secondary | ICD-10-CM

## 2023-10-15 DIAGNOSIS — M6281 Muscle weakness (generalized): Secondary | ICD-10-CM

## 2023-10-15 DIAGNOSIS — R252 Cramp and spasm: Secondary | ICD-10-CM

## 2023-10-15 DIAGNOSIS — M25561 Pain in right knee: Secondary | ICD-10-CM | POA: Diagnosis not present

## 2023-10-15 DIAGNOSIS — R293 Abnormal posture: Secondary | ICD-10-CM

## 2023-10-15 NOTE — Therapy (Signed)
 OUTPATIENT PHYSICAL THERAPY LOWER EXTREMITY TREATMENT   Patient Name: Annette Wilson MRN: [id number] DOB:24-Oct-1945, 78 y.o., female Today's Date: 10/15/2023  END OF SESSION:  PT End of Session - 10/15/23 0933     Visit Number 7    Date for PT Re-Evaluation 11/05/23    Authorization Type Medicare    PT Start Time 0930    PT Stop Time 1014    PT Time Calculation (min) 44 min    Activity Tolerance Patient tolerated treatment well    Behavior During Therapy [facility] for tasks assessed/performed             Past Medical History:  Diagnosis Date   Abscess 1954   developed abscesses after her tonsilectomy   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   Duodenal ulcer 1961   Hepatitis 1951   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   Past Surgical History:  Procedure Laterality Date   APPLICATION OF CRANIAL NAVIGATION N/A 06/02/2022   Procedure: APPLICATION OF CRANIAL NAVIGATION;  Surgeon: Jadene Pierini, MD;  Location: [facility] OR;  Service: Neurosurgery;  Laterality: N/A;   CRANIOTOMY Left 06/02/2022   Procedure: Left craniotomy for tumor resection;  Surgeon: Jadene Pierini, MD;  Location: [facility] OR;  Service: Neurosurgery;  Laterality: Left;  RM 20   DIAGNOSTIC LAPAROSCOPY  1978   looking for fallopian tube blockage   HAND SURGERY Left    tumor removed between 1 & 2 nd digits   TONSILLECTOMY  1954   Patient Active Problem List   Diagnosis Date Noted   Meningioma, recurrent of brain (HCC) 09/29/2023   Mild hyperlipidemia 09/01/2023   Bilateral primary osteoarthritis of knee 09/01/2023   Osteopenia 08/17/2022   Brain tumor (HCC) 06/02/2022    PCP: Thana Ates, MD  REFERRING PROVIDER: Rodolph Bong, MD  REFERRING DIAG:    M25.561,M25.562,G89.29 (ICD-10-CM) - Chronic pain of both knees  M81.0 (ICD-10-CM) - Age-related osteoporosis without current pathological fracture    THERAPY DIAG:  Chronic pain of right knee  Muscle weakness (generalized)  Difficulty in walking, not elsewhere  classified  Cramp and spasm  Chronic pain of left knee  Abnormal posture  Rationale for Evaluation and Treatment: Rehabilitation  ONSET DATE: 09/01/2023  SUBJECTIVE:   SUBJECTIVE STATEMENT: Patient reports she was a little tired after doing sessions back to back last week and she had 2 appts on Tuesday so to avoid this happening again, she cancelled her PT appt Tuesday.       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  Removed one year ago but surgeon could not get all of tumor due to some border attachment to the brain tissue.  It is beginning to grow again and she will likely be having radiation vs surgery this time.   PAIN:  10/07/23 (the 7/10 is happening less frequently, its only when I turn a certain way) Are you having pain? Yes: NPRS scale: 0/10 at rest , 7/10 at worst Pain location: bilateral knees, also low back Pain description: aching Aggravating factors: start up, stairs Relieving factors: nothing  PRECAUTIONS: None  RED FLAGS: None   WEIGHT BEARING RESTRICTIONS: No  FALLS:  Has patient fallen in last 6 months? No  LIVING ENVIRONMENT: Lives with: lives with their family Lives in: House/apartment Stairs: Yes: Internal: 12 steps; on right going up and External: 2 steps; none Has following equipment at home: None  OCCUPATION: Desk job   PLOF: Independent, Independent with basic ADLs, Independent with household mobility  without device, Independent with community mobility without device, Independent with homemaking with ambulation, Independent with gait, and Independent with transfers  PATIENT GOALS: To be able avoid losing more bone due to osteoporosis.    NEXT MD VISIT:  Evaluate and Treat for bilat knee pain and osteoporosis 1-2 times per week for 4-6 weeks.   Decrease pain, increase strength, flexibility, function, and range of motion.  Modalities may include, traction, ionto, phono, stim, and dry needling prn.  OBJECTIVE:  Note: Objective  measures were completed at Evaluation unless otherwise noted.  DIAGNOSTIC FINDINGS: Images not read yet but images show pronounced varus bilaterally with limited tibiofemoral space on both (Left >Right)  PATIENT SURVEYS:  LEFS 30/80  COGNITION: Overall cognitive status: Within functional limits for tasks assessed     SENSATION: WFL  POSTURE:  Pronounced varus deformity bilateral LE's Left > Right  PALPATION: Moderate global crepitus right knee, fairly isolated patellofemoral crepitus on left.   LOWER EXTREMITY ROM:  Lacking approx 10 degrees of full extension bilateral knees, otherwise ROM is [facility]  LOWER EXTREMITY MMT:  Hips generally 4/5 with exception of hip ER which was 4-/5 bilaterally,  hip extension 4-/5.    LOWER EXTREMITY SPECIAL TESTS:  Knee special tests: Apley's compression test: positive  and Patella tap test (ballotable patella): negative  FUNCTIONAL TESTS:  5 times sit to stand: 16.85 sec Timed up and go (TUG): 12.54 sec  GAIT: Distance walked: 30 feet Assistive device utilized: None Level of assistance: Modified independence Comments: slow, guarded, pronounced varus bilateral (Left > Right)                                                                                                                                TREATMENT DATE:  10/15/23 Nustep x 5 min level 5 (PT present to discuss status) Seated ball squeeze with purple ball x 20 Seated LAQ with 4# 2 x 10 with ball squeeze Seated clam with red loop x 20 Sit to stand x 10 with 5 lb kb Squat to chair x 10 with 5 lb kb Standing lateral band walks with yellow loop 5 laps at barre Standing heel raises x 20 Seated hamstring curl x 20 each with green tband Step ups 2 x 10 on 6" Lateral step ups 2 x 10 on 6" step (vc's to keep knee over ankle to reduce shearing force) Leg Press (seat 6) with 45 lbs x 20 SLS cone touches 3 x 10 each LE (cone on 8" step on its tall side with balance pad on top of  that) Hip matrix abduction and extension 2 x 10 with 25# both  10/07/23 Nustep x 5 min level 5 (PT present to discuss status) Leg Press (seat 6) with 45 lbs x 20 Seated ball squeeze with purple ball x 20 Seated LAQ with 4# 2 x 10 with ball squeeze Seated clam with red loop x 20 Sit to stand x 10 with  5 lb kb Squat to chair x 10 with 5 lb kb Seated hamstring curl x 20 each with red tband Standing heel raises x 20 Standing lateral band walks with yellow loop 5 laps at barre SLS cone touches 3 x 10 each LE (cone on 8" step on its tall side with balance pad on top of that) Step ups 2 x 10 on 6" Eccentric step down on 4" x 10 each LE (attempted lateral heel tap today but increased PF pain so continue with step downs) Hip matrix abduction and extension 2 x 10 with 25#  10/06/23 Nustep x 5 min level 5 (PT present to discuss status) Leg Press (seat 6) with 40 lbs x 20 Seated ball squeeze with purple ball x 20 Seated LAQ with 4# 2 x 10 with ball squeeze Seated clam with yellow loop x 20 Sit to stand x 10  Squat to chair x 10 Seated hamstring curl x 20 each with red tband Standing heel raises x 20 Standing lateral band walks with yellow loop 5 laps at barre Step ups 2 x 10 on 6" Eccentric step down on 4" x 10 each LE Hip matrix abduction and extension 2 x 10 with 25#  09/10/23 Initial eval completed and initiated HEP    PATIENT EDUCATION:  Education details: Initiated HEP Person educated: Patient Education method: Programmer, multimedia, Facilities manager, and Handouts Education comprehension: verbalized understanding, returned demonstration, and verbal cues required  HOME EXERCISE PROGRAM: Access Code: [email or web address] URL: [email or web address] Date: 09/24/2023 Prepared by: Mikey Kirschner  Exercises - Supine Quadricep Sets  - 1 x daily - 7 x weekly - 2 sets - 10 reps - Small Range Straight Leg Raise  - 1 x daily - 7 x weekly - 2 sets - 10 reps - Supine Knee Extension Strengthening   - 1 x daily - 7 x weekly - 2 sets - 10 reps - Seated Long Arc Quad  - 1 x daily - 7 x weekly - 2 sets - 10 reps - Sit to Stand Without Arm Support  - 1 x daily - 7 x weekly - 2 sets - 10 reps - Side Stepping with Resistance at Ankles and Counter Support  - 1 x daily - 7 x weekly - 3 sets - 10 reps  ASSESSMENT:  CLINICAL IMPRESSION: Voncille is progressing appropriately.  She was able to tolerate increased resistance on hamstring curls and was able to do lateral step ups.  She is having little to no pain per her report, unless she makes a pivot move and gets that joint line sharp pain.  She would benefit from continued skilled PT for quad rehab, hip strengthening and modalities to control pain.    OBJECTIVE IMPAIRMENTS: Abnormal gait, decreased balance, decreased mobility, difficulty walking, decreased ROM, decreased strength, increased fascial restrictions, increased muscle spasms, impaired flexibility, postural dysfunction, and pain.   ACTIVITY LIMITATIONS: carrying, lifting, bending, sitting, standing, squatting, sleeping, stairs, transfers, bathing, toileting, dressing, hygiene/grooming, and caring for others  PARTICIPATION LIMITATIONS: meal prep, cleaning, laundry, driving, shopping, community activity, yard work, and church  PERSONAL FACTORS: Age, Fitness, Time since onset of injury/illness/exacerbation, and 1-2 comorbidities: hx scarlett fever and brain tumor  are also affecting patient's functional outcome.   REHAB POTENTIAL: Good  CLINICAL DECISION MAKING: Stable/uncomplicated  EVALUATION COMPLEXITY: Low   GOALS: Goals reviewed with patient? Yes  SHORT TERM GOALS: Target date: 10/08/2023  Pain report to be no greater than 4/10  Baseline: Goal status: MET 10/06/23  2.  Patient  will be independent with initial HEP  Baseline:  Goal status: MET 10/06/23  LONG TERM GOALS: Target date: 11/05/2023   Pain report to be no greater than 4/10  Baseline:  Goal status: MET  Partially  2.  Patient to be independent with advanced HEP  Baseline:  Goal status: INITIAL  3.  Patient to be able to stand or walk for at least 15 min without leg pain  Baseline:  Goal status: INITIAL  4.  Patient to be able to ascend and descend steps without pain or no greater than 2/10  Baseline:  Goal status: INITIAL  5.  Patient to be able to bend, stoop and squat with pain no greater than 2/10  Baseline:  Goal status: INITIAL  6.  LEFS score to improve by 2-4 points Baseline:  Goal status: INITIAL   PLAN:  PT FREQUENCY: 2x/week  PT DURATION: 8 weeks  PLANNED INTERVENTIONS: 97110-Therapeutic exercises, 97530- Therapeutic activity, [id number]- Neuromuscular re-education, 97535- Self Care, 16109- Manual therapy, [id number]- Gait training, [phone]- Canalith repositioning, [id number]- Aquatic Therapy, 97760- Splinting, 97014- Electrical stimulation (unattended), [id number]- Electrical stimulation (manual), 97016- Vasopneumatic device, [id number]- Ultrasound, [id number]- Ionotophoresis 4mg /ml Dexamethasone, Patient/Family education, Balance training, Stair training, Taping, Dry Needling, Joint mobilization, Scar mobilization, Compression bandaging, Vestibular training, Visual/preceptual remediation/compensation, DME instructions, Cryotherapy, and Moist heat  PLAN FOR NEXT SESSION:  Nustep, continue to increase resistance, functional strengthening, eccentric step downs with 4" to tolerance, increase resistance with all quad rehab, glut med strengthening, hip extension strength   Murphy Duzan B. Niraj Kudrna, PT 10/15/23 10:14 AM [facility] [address], [address] [place], [place] [address] Phone # [phone] Fax [phone]

## 2023-10-20 ENCOUNTER — Other Ambulatory Visit: Payer: Self-pay | Admitting: Radiation Therapy

## 2023-10-20 ENCOUNTER — Ambulatory Visit: Payer: Medicare Other

## 2023-10-20 DIAGNOSIS — M25561 Pain in right knee: Secondary | ICD-10-CM | POA: Diagnosis not present

## 2023-10-20 DIAGNOSIS — R262 Difficulty in walking, not elsewhere classified: Secondary | ICD-10-CM | POA: Diagnosis not present

## 2023-10-20 DIAGNOSIS — G8929 Other chronic pain: Secondary | ICD-10-CM

## 2023-10-20 DIAGNOSIS — M6281 Muscle weakness (generalized): Secondary | ICD-10-CM

## 2023-10-20 DIAGNOSIS — R252 Cramp and spasm: Secondary | ICD-10-CM

## 2023-10-20 DIAGNOSIS — R293 Abnormal posture: Secondary | ICD-10-CM

## 2023-10-20 DIAGNOSIS — D329 Benign neoplasm of meninges, unspecified: Secondary | ICD-10-CM

## 2023-10-20 DIAGNOSIS — M25562 Pain in left knee: Secondary | ICD-10-CM | POA: Diagnosis not present

## 2023-10-20 NOTE — Therapy (Signed)
 OUTPATIENT PHYSICAL THERAPY LOWER EXTREMITY TREATMENT   Patient Name: Annette Wilson MRN: [id number] DOB:February 13, 1946, 78 y.o., female Today's Date: 10/20/2023  END OF SESSION:  PT End of Session - 10/20/23 1115     Visit Number 8    Date for PT Re-Evaluation 11/05/23    Authorization Type Medicare    PT Start Time 1105    PT Stop Time 1147    PT Time Calculation (min) 42 min    Activity Tolerance Patient tolerated treatment well    Behavior During Therapy [facility] for tasks assessed/performed             Past Medical History:  Diagnosis Date   Abscess 1954   developed abscesses after her tonsilectomy   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   Duodenal ulcer 1961   Hepatitis 1951   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   Past Surgical History:  Procedure Laterality Date   APPLICATION OF CRANIAL NAVIGATION N/A 06/02/2022   Procedure: APPLICATION OF CRANIAL NAVIGATION;  Surgeon: Jadene Pierini, MD;  Location: [facility] OR;  Service: Neurosurgery;  Laterality: N/A;   CRANIOTOMY Left 06/02/2022   Procedure: Left craniotomy for tumor resection;  Surgeon: Jadene Pierini, MD;  Location: [facility] OR;  Service: Neurosurgery;  Laterality: Left;  RM 20   DIAGNOSTIC LAPAROSCOPY  1978   looking for fallopian tube blockage   HAND SURGERY Left    tumor removed between 1 & 2 nd digits   TONSILLECTOMY  1954   Patient Active Problem List   Diagnosis Date Noted   Meningioma, recurrent of brain (HCC) 09/29/2023   Mild hyperlipidemia 09/01/2023   Bilateral primary osteoarthritis of knee 09/01/2023   Osteopenia 08/17/2022   Brain tumor (HCC) 06/02/2022    PCP: Thana Ates, MD  REFERRING PROVIDER: Rodolph Bong, MD  REFERRING DIAG:    M25.561,M25.562,G89.29 (ICD-10-CM) - Chronic pain of both knees  M81.0 (ICD-10-CM) - Age-related osteoporosis without current pathological fracture    THERAPY DIAG:  Chronic pain of right knee  Muscle weakness (generalized)  Difficulty in walking, not elsewhere  classified  Cramp and spasm  Abnormal posture  Chronic pain of left knee  Rationale for Evaluation and Treatment: Rehabilitation  ONSET DATE: 09/01/2023  SUBJECTIVE:   SUBJECTIVE STATEMENT: Patient reports she was a little tired after doing sessions back to back last week and she had 2 appts on Tuesday so to avoid this happening again, she cancelled her PT appt Tuesday.       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  Removed one year ago but surgeon could not get all of tumor due to some border attachment to the brain tissue.  It is beginning to grow again and she will likely be having radiation vs surgery this time.   PAIN:  10/07/23 (the 7/10 is happening less frequently, its only when I turn a certain way) Are you having pain? Yes: NPRS scale: 0/10 at rest , 7/10 at worst Pain location: bilateral knees, also low back Pain description: aching Aggravating factors: start up, stairs Relieving factors: nothing  PRECAUTIONS: None  RED FLAGS: None   WEIGHT BEARING RESTRICTIONS: No  FALLS:  Has patient fallen in last 6 months? No  LIVING ENVIRONMENT: Lives with: lives with their family Lives in: House/apartment Stairs: Yes: Internal: 12 steps; on right going up and External: 2 steps; none Has following equipment at home: None  OCCUPATION: Desk job   PLOF: Independent, Independent with basic ADLs, Independent with household mobility  without device, Independent with community mobility without device, Independent with homemaking with ambulation, Independent with gait, and Independent with transfers  PATIENT GOALS: To be able avoid losing more bone due to osteoporosis.    NEXT MD VISIT:  Evaluate and Treat for bilat knee pain and osteoporosis 1-2 times per week for 4-6 weeks.   Decrease pain, increase strength, flexibility, function, and range of motion.  Modalities may include, traction, ionto, phono, stim, and dry needling prn.  OBJECTIVE:  Note: Objective  measures were completed at Evaluation unless otherwise noted.  DIAGNOSTIC FINDINGS: Images not read yet but images show pronounced varus bilaterally with limited tibiofemoral space on both (Left >Right)  PATIENT SURVEYS:  LEFS 30/80  COGNITION: Overall cognitive status: Within functional limits for tasks assessed     SENSATION: WFL  POSTURE:  Pronounced varus deformity bilateral LE's Left > Right  PALPATION: Moderate global crepitus right knee, fairly isolated patellofemoral crepitus on left.   LOWER EXTREMITY ROM:  Lacking approx 10 degrees of full extension bilateral knees, otherwise ROM is [facility]  LOWER EXTREMITY MMT:  Hips generally 4/5 with exception of hip ER which was 4-/5 bilaterally,  hip extension 4-/5.    LOWER EXTREMITY SPECIAL TESTS:  Knee special tests: Apley's compression test: positive  and Patella tap test (ballotable patella): negative  FUNCTIONAL TESTS:  5 times sit to stand: 16.85 sec Timed up and go (TUG): 12.54 sec  GAIT: Distance walked: 30 feet Assistive device utilized: None Level of assistance: Modified independence Comments: slow, guarded, pronounced varus bilateral (Left > Right)                                                                                                                                TREATMENT DATE:  10/15/23 Nustep x 5 min level 5 (PT present to discuss status) Sit to stand x 10 with 10 lb kb Squat to chair x 10 with 5 lb kb Seated LAQ x 20 with 5 lbs Seated march x 20 with 5 lbs Seated hip ER x 20 with 5 lbs Lateral band walks with yellow loop x 5 laps at back counter Heel raises x 20 Standing hamstring curl with 5 lbs 2 x 10 each Standing hip abduction and extension with 5 lbs 2 x 10 each Leg Press (seat 6) with 45 lbs x 20  10/15/23 Nustep x 5 min level 5 (PT present to discuss status) Seated ball squeeze with purple ball x 20 Seated LAQ with 4# 2 x 10 with ball squeeze Seated clam with red loop x 20 Sit to  stand x 10 with 5 lb kb Squat to chair x 10 with 5 lb kb Standing lateral band walks with yellow loop 5 laps at barre Standing heel raises x 20 Seated hamstring curl x 20 each with green tband Step ups 2 x 10 on 6" Lateral step ups 2 x 10 on 6" step (vc's to keep knee over ankle  to reduce shearing force) Leg Press (seat 6) with 45 lbs x 20 SLS cone touches 3 x 10 each LE (cone on 8" step on its tall side with balance pad on top of that) Hip matrix abduction and extension 2 x 10 with 25# both  10/07/23 Nustep x 5 min level 5 (PT present to discuss status) Leg Press (seat 6) with 45 lbs x 20 Seated ball squeeze with purple ball x 20 Seated LAQ with 4# 2 x 10 with ball squeeze Seated clam with red loop x 20 Sit to stand x 10 with 5 lb kb Squat to chair x 10 with 5 lb kb Seated hamstring curl x 20 each with red tband Standing heel raises x 20 Standing lateral band walks with yellow loop 5 laps at barre SLS cone touches 3 x 10 each LE (cone on 8" step on its tall side with balance pad on top of that) Step ups 2 x 10 on 6" Eccentric step down on 4" x 10 each LE (attempted lateral heel tap today but increased PF pain so continue with step downs) Hip matrix abduction and extension 2 x 10 with 25#  09/10/23 Initial eval completed and initiated HEP    PATIENT EDUCATION:  Education details: Initiated HEP Person educated: Patient Education method: Programmer, multimedia, Facilities manager, and Handouts Education comprehension: verbalized understanding, returned demonstration, and verbal cues required  HOME EXERCISE PROGRAM: Access Code: [email or web address] URL: [email or web address] Date: 09/24/2023 Prepared by: Mikey Kirschner  Exercises - Supine Quadricep Sets  - 1 x daily - 7 x weekly - 2 sets - 10 reps - Small Range Straight Leg Raise  - 1 x daily - 7 x weekly - 2 sets - 10 reps - Supine Knee Extension Strengthening  - 1 x daily - 7 x weekly - 2 sets - 10 reps - Seated Long Arc Quad  - 1 x  daily - 7 x weekly - 2 sets - 10 reps - Sit to Stand Without Arm Support  - 1 x daily - 7 x weekly - 2 sets - 10 reps - Side Stepping with Resistance at Ankles and Counter Support  - 1 x daily - 7 x weekly - 3 sets - 10 reps  ASSESSMENT:  CLINICAL IMPRESSION: Annette Wilson is doing well.  LE posture is improving and she appears more symmetrical with standing and walking posture. She is very well motivated and compliant with her HEP.   She is having little to no pain per her report with exception of the occasional joint line pain.  She would benefit from continued skilled PT for quad rehab, hip strengthening and modalities to control pain.    OBJECTIVE IMPAIRMENTS: Abnormal gait, decreased balance, decreased mobility, difficulty walking, decreased ROM, decreased strength, increased fascial restrictions, increased muscle spasms, impaired flexibility, postural dysfunction, and pain.   ACTIVITY LIMITATIONS: carrying, lifting, bending, sitting, standing, squatting, sleeping, stairs, transfers, bathing, toileting, dressing, hygiene/grooming, and caring for others  PARTICIPATION LIMITATIONS: meal prep, cleaning, laundry, driving, shopping, community activity, yard work, and church  PERSONAL FACTORS: Age, Fitness, Time since onset of injury/illness/exacerbation, and 1-2 comorbidities: hx scarlett fever and brain tumor  are also affecting patient's functional outcome.   REHAB POTENTIAL: Good  CLINICAL DECISION MAKING: Stable/uncomplicated  EVALUATION COMPLEXITY: Low   GOALS: Goals reviewed with patient? Yes  SHORT TERM GOALS: Target date: 10/08/2023  Pain report to be no greater than 4/10  Baseline: Goal status: MET 10/06/23  2.  Patient will be independent with initial HEP  Baseline:  Goal status: MET 10/06/23  LONG TERM GOALS: Target date: 11/05/2023   Pain report to be no greater than 4/10  Baseline:  Goal status: MET Partially  2.  Patient to be independent with advanced HEP  Baseline:   Goal status: INITIAL  3.  Patient to be able to stand or walk for at least 15 min without leg pain  Baseline:  Goal status: INITIAL  4.  Patient to be able to ascend and descend steps without pain or no greater than 2/10  Baseline:  Goal status: INITIAL  5.  Patient to be able to bend, stoop and squat with pain no greater than 2/10  Baseline:  Goal status: INITIAL  6.  LEFS score to improve by 2-4 points Baseline:  Goal status: INITIAL   PLAN:  PT FREQUENCY: 2x/week  PT DURATION: 8 weeks  PLANNED INTERVENTIONS: 97110-Therapeutic exercises, 97530- Therapeutic activity, [id number]- Neuromuscular re-education, 97535- Self Care, 16109- Manual therapy, [id number]- Gait training, [phone]- Canalith repositioning, [id number]- Aquatic Therapy, 97760- Splinting, 97014- Electrical stimulation (unattended), [id number]- Electrical stimulation (manual), 97016- Vasopneumatic device, [id number]- Ultrasound, [id number]- Ionotophoresis 4mg /ml Dexamethasone, Patient/Family education, Balance training, Stair training, Taping, Dry Needling, Joint mobilization, Scar mobilization, Compression bandaging, Vestibular training, Visual/preceptual remediation/compensation, DME instructions, Cryotherapy, and Moist heat  PLAN FOR NEXT SESSION:  Nustep, continue to increase resistance, functional strengthening, eccentric step downs with 4" to tolerance, increase resistance with all quad rehab, glut med strengthening, hip extension strength   Annette Wilson, PT 10/20/23 12:00 PM [facility] [address], [address] [place], [place] [address] Phone # [phone] Fax [phone]

## 2023-10-21 ENCOUNTER — Ambulatory Visit
Admission: RE | Admit: 2023-10-21 | Discharge: 2023-10-21 | Disposition: A | Discharge status: Home / Self Care | Source: Ambulatory Visit | Attending: Radiation Oncology | Admitting: Radiation Oncology

## 2023-10-21 ENCOUNTER — Ambulatory Visit: Admitting: Radiation Oncology

## 2023-10-21 ENCOUNTER — Ambulatory Visit

## 2023-10-21 DIAGNOSIS — D320 Benign neoplasm of cerebral meninges: Secondary | ICD-10-CM | POA: Diagnosis not present

## 2023-10-21 DIAGNOSIS — D329 Benign neoplasm of meninges, unspecified: Secondary | ICD-10-CM

## 2023-10-21 MED ORDER — GADOPICLENOL 0.5 MMOL/ML IV SOLN
7.0000 mL | Freq: Once | INTRAVENOUS | Status: AC | PRN
  Administered 2023-10-21: 7 mL via INTRAVENOUS

## 2023-10-22 ENCOUNTER — Ambulatory Visit: Payer: Medicare Other

## 2023-10-22 DIAGNOSIS — R293 Abnormal posture: Secondary | ICD-10-CM

## 2023-10-22 DIAGNOSIS — R262 Difficulty in walking, not elsewhere classified: Secondary | ICD-10-CM | POA: Diagnosis not present

## 2023-10-22 DIAGNOSIS — G8929 Other chronic pain: Secondary | ICD-10-CM

## 2023-10-22 DIAGNOSIS — R252 Cramp and spasm: Secondary | ICD-10-CM

## 2023-10-22 DIAGNOSIS — M6281 Muscle weakness (generalized): Secondary | ICD-10-CM

## 2023-10-22 DIAGNOSIS — M25562 Pain in left knee: Secondary | ICD-10-CM | POA: Diagnosis not present

## 2023-10-22 DIAGNOSIS — M25561 Pain in right knee: Secondary | ICD-10-CM | POA: Diagnosis not present

## 2023-10-22 NOTE — Therapy (Signed)
 OUTPATIENT PHYSICAL THERAPY LOWER EXTREMITY TREATMENT   Patient Name: Annette Wilson MRN: [id number] DOB:January 14, 1946, 78 y.o., female Today's Date: 10/22/2023  END OF SESSION:  PT End of Session - 10/22/23 1113     Visit Number 9    Date for PT Re-Evaluation 11/05/23    Authorization Type Medicare    Progress Note Due on Visit 10    PT Start Time 1108    PT Stop Time 1146    PT Time Calculation (min) 38 min    Activity Tolerance Patient tolerated treatment well    Behavior During Therapy [facility] for tasks assessed/performed             Past Medical History:  Diagnosis Date   Abscess 1954   developed abscesses after her tonsilectomy   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   Duodenal ulcer 1961   Hepatitis 1951   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   Past Surgical History:  Procedure Laterality Date   APPLICATION OF CRANIAL NAVIGATION N/A 06/02/2022   Procedure: APPLICATION OF CRANIAL NAVIGATION;  Surgeon: Jadene Pierini, MD;  Location: [facility] OR;  Service: Neurosurgery;  Laterality: N/A;   CRANIOTOMY Left 06/02/2022   Procedure: Left craniotomy for tumor resection;  Surgeon: Jadene Pierini, MD;  Location: [facility] OR;  Service: Neurosurgery;  Laterality: Left;  RM 20   DIAGNOSTIC LAPAROSCOPY  1978   looking for fallopian tube blockage   HAND SURGERY Left    tumor removed between 1 & 2 nd digits   TONSILLECTOMY  1954   Patient Active Problem List   Diagnosis Date Noted   Meningioma, recurrent of brain (HCC) 09/29/2023   Mild hyperlipidemia 09/01/2023   Bilateral primary osteoarthritis of knee 09/01/2023   Osteopenia 08/17/2022   Brain tumor (HCC) 06/02/2022    PCP: Thana Ates, MD  REFERRING PROVIDER: Rodolph Bong, MD  REFERRING DIAG:    M25.561,M25.562,G89.29 (ICD-10-CM) - Chronic pain of both knees  M81.0 (ICD-10-CM) - Age-related osteoporosis without current pathological fracture    THERAPY DIAG:  Chronic pain of right knee  Muscle weakness  (generalized)  Difficulty in walking, not elsewhere classified  Cramp and spasm  Abnormal posture  Chronic pain of left knee  Rationale for Evaluation and Treatment: Rehabilitation  ONSET DATE: 09/01/2023  SUBJECTIVE:   SUBJECTIVE STATEMENT: Patient reports she had onset of pain in right low back immediately after last visit.  She thought it may have been the hip abduction standing at the back counter.   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  Removed one year ago but surgeon could not get all of tumor due to some border attachment to the brain tissue.  It is beginning to grow again and she will likely be having radiation vs surgery this time.   PAIN:  10/22/23  Are you having pain? Yes: NPRS scale: 0/10 at rest , 7/10 at worst Pain location: bilateral knees, also low back Pain description: aching Aggravating factors: start up, stairs Relieving factors: nothing  PRECAUTIONS: None  RED FLAGS: None   WEIGHT BEARING RESTRICTIONS: No  FALLS:  Has patient fallen in last 6 months? No  LIVING ENVIRONMENT: Lives with: lives with their family Lives in: House/apartment Stairs: Yes: Internal: 12 steps; on right going up and External: 2 steps; none Has following equipment at home: None  OCCUPATION: Desk job   PLOF: Independent, Independent with basic ADLs, Independent with household mobility without device, Independent with community mobility without device, Independent with homemaking with  ambulation, Independent with gait, and Independent with transfers  PATIENT GOALS: To be able avoid losing more bone due to osteoporosis.    NEXT MD VISIT:  Evaluate and Treat for bilat knee pain and osteoporosis 1-2 times per week for 4-6 weeks.   Decrease pain, increase strength, flexibility, function, and range of motion.  Modalities may include, traction, ionto, phono, stim, and dry needling prn.  OBJECTIVE:  Note: Objective measures were completed at Evaluation unless  otherwise noted.  DIAGNOSTIC FINDINGS: Images not read yet but images show pronounced varus bilaterally with limited tibiofemoral space on both (Left >Right)  PATIENT SURVEYS:  LEFS 30/80  COGNITION: Overall cognitive status: Within functional limits for tasks assessed     SENSATION: WFL  POSTURE:  Pronounced varus deformity bilateral LE's Left > Right  PALPATION: Moderate global crepitus right knee, fairly isolated patellofemoral crepitus on left.   LOWER EXTREMITY ROM:  Lacking approx 10 degrees of full extension bilateral knees, otherwise ROM is [facility]  LOWER EXTREMITY MMT:  Hips generally 4/5 with exception of hip ER which was 4-/5 bilaterally,  hip extension 4-/5.    LOWER EXTREMITY SPECIAL TESTS:  Knee special tests: Apley's compression test: positive  and Patella tap test (ballotable patella): negative  FUNCTIONAL TESTS:  5 times sit to stand: 16.85 sec Timed up and go (TUG): 12.54 sec  GAIT: Distance walked: 30 feet Assistive device utilized: None Level of assistance: Modified independence Comments: slow, guarded, pronounced varus bilateral (Left > Right)                                                                                                                                TREATMENT DATE:  10/22/23 Nustep x 5 min level 5 (PT present to discuss status) Sit to stand x 10 with 10 lb kb Squat to mat table x 10 with 5 lb kb Seated LAQ x 20 with 5 lbs Seated march x 20 with 5 lbs Seated hip ER x 20 with 5 lbs Lateral band walks with yellow loop x 5 laps at back counter Standing hamstring curl with 0 lbs 2 x 10 each Standing hip abduction and extension with 0 lbs 2 x 10 each Heel raises x 20 Leg Press (seat 6) with 45 lbs x 20  10/15/23 Nustep x 5 min level 5 (PT present to discuss status) Sit to stand x 10 with 10 lb kb Squat to chair x 10 with 5 lb kb Seated LAQ x 20 with 5 lbs Seated march x 20 with 5 lbs Seated hip ER x 20 with 5 lbs Lateral band  walks with yellow loop x 5 laps at back counter Heel raises x 20 Standing hamstring curl with 5 lbs 2 x 10 each Standing hip abduction and extension with 5 lbs 2 x 10 each Leg Press (seat 6) with 50 lbs x 20 Rocker board x 2 min for ankle mobility Step up on 6" step x  10 each LE fwd, then x 10 each LE lateral   10/15/23 Nustep x 5 min level 5 (PT present to discuss status) Seated ball squeeze with purple ball x 20 Seated LAQ with 4# 2 x 10 with ball squeeze Seated clam with red loop x 20 Sit to stand x 10 with 5 lb kb Squat to chair x 10 with 5 lb kb Standing lateral band walks with yellow loop 5 laps at barre Standing heel raises x 20 Seated hamstring curl x 20 each with green tband Step ups 2 x 10 on 6" Lateral step ups 2 x 10 on 6" step (vc's to keep knee over ankle to reduce shearing force) Leg Press (seat 6) with 45 lbs x 20 SLS cone touches 3 x 10 each LE (cone on 8" step on its tall side with balance pad on top of that) Hip matrix abduction and extension 2 x 10 with 25# both  09/10/23 Initial eval completed and initiated HEP    PATIENT EDUCATION:  Education details: Initiated HEP Person educated: Patient Education method: Programmer, multimedia, Facilities manager, and Handouts Education comprehension: verbalized understanding, returned demonstration, and verbal cues required  HOME EXERCISE PROGRAM: Access Code: [email or web address] URL: [email or web address] Date: 09/24/2023 Prepared by: Mikey Kirschner  Exercises - Supine Quadricep Sets  - 1 x daily - 7 x weekly - 2 sets - 10 reps - Small Range Straight Leg Raise  - 1 x daily - 7 x weekly - 2 sets - 10 reps - Supine Knee Extension Strengthening  - 1 x daily - 7 x weekly - 2 sets - 10 reps - Seated Long Arc Quad  - 1 x daily - 7 x weekly - 2 sets - 10 reps - Sit to Stand Without Arm Support  - 1 x daily - 7 x weekly - 2 sets - 10 reps - Side Stepping with Resistance at Ankles and Counter Support  - 1 x daily - 7 x weekly - 3  sets - 10 reps  ASSESSMENT:  CLINICAL IMPRESSION: Laresha had some right low back pain after last visit.  She has a trunk shift and this may have originated from some of the new standing exercises such as hamstring curl.  We dropped the resistance on these exercises today and she was able to do those without increase in those right low back symptoms.   She would benefit from continued skilled PT for quad rehab, hip strengthening and modalities to control pain.    OBJECTIVE IMPAIRMENTS: Abnormal gait, decreased balance, decreased mobility, difficulty walking, decreased ROM, decreased strength, increased fascial restrictions, increased muscle spasms, impaired flexibility, postural dysfunction, and pain.   ACTIVITY LIMITATIONS: carrying, lifting, bending, sitting, standing, squatting, sleeping, stairs, transfers, bathing, toileting, dressing, hygiene/grooming, and caring for others  PARTICIPATION LIMITATIONS: meal prep, cleaning, laundry, driving, shopping, community activity, yard work, and church  PERSONAL FACTORS: Age, Fitness, Time since onset of injury/illness/exacerbation, and 1-2 comorbidities: hx scarlett fever and brain tumor  are also affecting patient's functional outcome.   REHAB POTENTIAL: Good  CLINICAL DECISION MAKING: Stable/uncomplicated  EVALUATION COMPLEXITY: Low   GOALS: Goals reviewed with patient? Yes  SHORT TERM GOALS: Target date: 10/08/2023  Pain report to be no greater than 4/10  Baseline: Goal status: MET 10/06/23  2.  Patient will be independent with initial HEP  Baseline:  Goal status: MET 10/06/23  LONG TERM GOALS: Target date: 11/05/2023   Pain report to be no greater than 4/10  Baseline:  Goal status: MET Partially  2.  Patient to be independent with advanced HEP  Baseline:  Goal status: In Progress  3.  Patient to be able to stand or walk for at least 15 min without leg pain  Baseline:  Goal status: In Progress  4.  Patient to be able to  ascend and descend steps without pain or no greater than 2/10  Baseline:  Goal status: MET 10/22/23  5.  Patient to be able to bend, stoop and squat with pain no greater than 2/10  Baseline:  Goal status: INITIAL  6.  LEFS score to improve by 2-4 points Baseline:  Goal status: INITIAL   PLAN:  PT FREQUENCY: 2x/week  PT DURATION: 8 weeks  PLANNED INTERVENTIONS: 97110-Therapeutic exercises, 97530- Therapeutic activity, [id number]- Neuromuscular re-education, 97535- Self Care, 16109- Manual therapy, [id number]- Gait training, [phone]- Canalith repositioning, [id number]- Aquatic Therapy, 97760- Splinting, 97014- Electrical stimulation (unattended), [id number]- Electrical stimulation (manual), 97016- Vasopneumatic device, [id number]- Ultrasound, [id number]- Ionotophoresis 4mg /ml Dexamethasone, Patient/Family education, Balance training, Stair training, Taping, Dry Needling, Joint mobilization, Scar mobilization, Compression bandaging, Vestibular training, Visual/preceptual remediation/compensation, DME instructions, Cryotherapy, and Moist heat  PLAN FOR NEXT SESSION:  Nustep, continue to increase resistance, functional strengthening, eccentric step downs with 4" to tolerance, increase resistance with all quad rehab, glut med strengthening, hip extension strength   Mirtha Jain B. Keymoni Mccaster, PT 10/22/23 3:46 PM [facility] [address] [place], [place] [address] Phone # [phone] Fax [phone]

## 2023-10-23 ENCOUNTER — Ambulatory Visit: Admitting: Radiation Oncology

## 2023-10-26 ENCOUNTER — Ambulatory Visit
Admission: RE | Admit: 2023-10-26 | Discharge: 2023-10-26 | Disposition: A | Discharge status: Home / Self Care | Source: Ambulatory Visit | Attending: Radiation Oncology | Admitting: Radiation Oncology

## 2023-10-26 VITALS — BP 163/68 | HR 64 | Temp 97.7°F | Resp 18 | Ht 63.5 in | Wt 152.2 lb

## 2023-10-26 DIAGNOSIS — D329 Benign neoplasm of meninges, unspecified: Secondary | ICD-10-CM | POA: Insufficient documentation

## 2023-10-26 DIAGNOSIS — D320 Benign neoplasm of cerebral meninges: Secondary | ICD-10-CM

## 2023-10-26 LAB — BUN & CREATININE (CHCC)
BUN: 17 mg/dL (ref 8–23)
Creatinine: 0.78 mg/dL (ref 0.44–1.00)
GFR, Estimated: >60 mL/min (ref >60)

## 2023-10-26 MED ORDER — SODIUM CHLORIDE 0.9% FLUSH
10.0000 mL | Freq: Once | INTRAVENOUS | Status: AC
  Administered 2023-10-26: 10 mL via INTRAVENOUS

## 2023-10-26 NOTE — Progress Notes (Signed)
 Has armband been applied?  Yes.    Does patient have an allergy to IV contrast dye?: No.   Has patient ever received premedication for IV contrast dye?: No.   Date of lab work: October 26, 2023 BUN: 17 CR: 0.78 eGFR: >60  Does patient take metformin?: No.  Is eGFR >60?: Yes.   If no, when can patient resume? (Must be 48 hrs AFTER they receive IV contrast):  N/A  IV site: antecubital left, condition patent and no redness  Has IV site been added to flowsheet?  Yes.    BP (!) 163/68 (BP Location: Right Arm, Patient Position: Sitting, Cuff Size: Normal)   Pulse 64   Temp 97.7 F (36.5 C)   Resp 18   Ht 5' 3.5" (1.613 m)   Wt 152 lb 3.2 oz (69 kg)   SpO2 97%   BMI 26.54 kg/m

## 2023-10-27 ENCOUNTER — Ambulatory Visit: Payer: Medicare Other | Attending: Family Medicine

## 2023-10-27 DIAGNOSIS — R252 Cramp and spasm: Secondary | ICD-10-CM

## 2023-10-27 DIAGNOSIS — G8929 Other chronic pain: Secondary | ICD-10-CM | POA: Diagnosis not present

## 2023-10-27 DIAGNOSIS — R293 Abnormal posture: Secondary | ICD-10-CM

## 2023-10-27 DIAGNOSIS — M25562 Pain in left knee: Secondary | ICD-10-CM | POA: Insufficient documentation

## 2023-10-27 DIAGNOSIS — M25561 Pain in right knee: Secondary | ICD-10-CM | POA: Insufficient documentation

## 2023-10-27 DIAGNOSIS — R262 Difficulty in walking, not elsewhere classified: Secondary | ICD-10-CM

## 2023-10-27 DIAGNOSIS — M6281 Muscle weakness (generalized): Secondary | ICD-10-CM | POA: Diagnosis not present

## 2023-10-27 NOTE — Therapy (Signed)
 OUTPATIENT PHYSICAL THERAPY LOWER EXTREMITY TREATMENT   Patient Name: Annette Wilson MRN: [id number] DOB:Dec 21, 1945, 78 y.o., female Today's Date: 10/27/2023  END OF SESSION:  PT End of Session - 10/27/23 1114     Visit Number 10    Date for PT Re-Evaluation 11/05/23    Authorization Type Medicare    Progress Note Due on Visit 20    PT Start Time 1103    PT Stop Time 1145    PT Time Calculation (min) 42 min    Activity Tolerance Patient tolerated treatment well    Behavior During Therapy [facility] for tasks assessed/performed             Past Medical History:  Diagnosis Date   Abscess 1954   developed abscesses after her tonsilectomy   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   Duodenal ulcer 1961   Hepatitis 1951   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   Past Surgical History:  Procedure Laterality Date   APPLICATION OF CRANIAL NAVIGATION N/A 06/02/2022   Procedure: APPLICATION OF CRANIAL NAVIGATION;  Surgeon: Jadene Pierini, MD;  Location: [facility] OR;  Service: Neurosurgery;  Laterality: N/A;   CRANIOTOMY Left 06/02/2022   Procedure: Left craniotomy for tumor resection;  Surgeon: Jadene Pierini, MD;  Location: [facility] OR;  Service: Neurosurgery;  Laterality: Left;  RM 20   DIAGNOSTIC LAPAROSCOPY  1978   looking for fallopian tube blockage   HAND SURGERY Left    tumor removed between 1 & 2 nd digits   TONSILLECTOMY  1954   Patient Active Problem List   Diagnosis Date Noted   Meningioma, recurrent of brain (HCC) 09/29/2023   Mild hyperlipidemia 09/01/2023   Bilateral primary osteoarthritis of knee 09/01/2023   Osteopenia 08/17/2022   Brain tumor (HCC) 06/02/2022    PCP: Thana Ates, MD  REFERRING PROVIDER: Rodolph Bong, MD  REFERRING DIAG:    M25.561,M25.562,G89.29 (ICD-10-CM) - Chronic pain of both knees  M81.0 (ICD-10-CM) - Age-related osteoporosis without current pathological fracture    THERAPY DIAG:  Chronic pain of right knee  Chronic pain of left  knee  Difficulty in walking, not elsewhere classified  Muscle weakness (generalized)  Cramp and spasm  Abnormal posture  Rationale for Evaluation and Treatment: Rehabilitation  ONSET DATE: 09/01/2023  SUBJECTIVE:   SUBJECTIVE STATEMENT: Patient reports she is doing well.  Back pain is much better.   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  Removed one year ago but surgeon could not get all of tumor due to some border attachment to the brain tissue.  It is beginning to grow again and she will likely be having radiation vs surgery this time.   PAIN:  10/22/23  Are you having pain? Yes: NPRS scale: 0/10 at rest , 7/10 at worst Pain location: bilateral knees, also low back Pain description: aching Aggravating factors: start up, stairs Relieving factors: nothing  PRECAUTIONS: None  RED FLAGS: None   WEIGHT BEARING RESTRICTIONS: No  FALLS:  Has patient fallen in last 6 months? No  LIVING ENVIRONMENT: Lives with: lives with their family Lives in: House/apartment Stairs: Yes: Internal: 12 steps; on right going up and External: 2 steps; none Has following equipment at home: None  OCCUPATION: Desk job   PLOF: Independent, Independent with basic ADLs, Independent with household mobility without device, Independent with community mobility without device, Independent with homemaking with ambulation, Independent with gait, and Independent with transfers  PATIENT GOALS: To be able avoid losing more bone  due to osteoporosis.    NEXT MD VISIT:  Evaluate and Treat for bilat knee pain and osteoporosis 1-2 times per week for 4-6 weeks.   Decrease pain, increase strength, flexibility, function, and range of motion.  Modalities may include, traction, ionto, phono, stim, and dry needling prn.  OBJECTIVE:  Note: Objective measures were completed at Evaluation unless otherwise noted.  DIAGNOSTIC FINDINGS: Images not read yet but images show pronounced varus bilaterally  with limited tibiofemoral space on both (Left >Right)  PATIENT SURVEYS:  LEFS 30/80  COGNITION: Overall cognitive status: Within functional limits for tasks assessed     SENSATION: WFL  POSTURE:  Pronounced varus deformity bilateral LE's Left > Right  PALPATION: Moderate global crepitus right knee, fairly isolated patellofemoral crepitus on left.   LOWER EXTREMITY ROM:  Lacking approx 10 degrees of full extension bilateral knees, otherwise ROM is [facility]  LOWER EXTREMITY MMT:  Hips generally 4/5 with exception of hip ER which was 4-/5 bilaterally,  hip extension 4-/5.    LOWER EXTREMITY SPECIAL TESTS:  Knee special tests: Apley's compression test: positive  and Patella tap test (ballotable patella): negative  FUNCTIONAL TESTS:  5 times sit to stand: 16.85 sec Timed up and go (TUG): 12.54 sec  GAIT: Distance walked: 30 feet Assistive device utilized: None Level of assistance: Modified independence Comments: slow, guarded, pronounced varus bilateral (Left > Right)                                                                                                                                TREATMENT DATE:  10/27/23 Nustep x 5 min level 5 (PT present to discuss status) Sit to stand x 10 with 10 lb kb Squat to mat table x 10 with 5 lb kb Seated LAQ x 20 with 6 lbs Seated march x 20 with 6 lbs Seated hip ER x 20 with 6 lbs Seated clam with red loop x 20 Lateral band walks with yellow loop x 5 laps at back counter Step ups 2 x 10 on 6" Lateral step ups 2 x 10 on 6" step (vc's to keep knee over ankle to reduce shearing force) Lateral eccentric heel tap x 10 each LE Leg Press (seat 6) with 45 lbs x 20 Standing hamstring curl with 0 lbs 2 x 10 each   10/22/23 Nustep x 5 min level 5 (PT present to discuss status) Sit to stand x 10 with 10 lb kb Squat to mat table x 10 with 5 lb kb Seated LAQ x 20 with 5 lbs Seated march x 20 with 5 lbs Seated hip ER x 20 with 5  lbs Lateral band walks with yellow loop x 5 laps at back counter Standing hamstring curl with 0 lbs 2 x 10 each Standing hip abduction and extension with 0 lbs 2 x 10 each Heel raises x 20 Leg Press (seat 6) with 45 lbs x 20  10/15/23 Nustep x 5  min level 5 (PT present to discuss status) Sit to stand x 10 with 10 lb kb Squat to chair x 10 with 5 lb kb Seated LAQ x 20 with 5 lbs Seated march x 20 with 5 lbs Seated hip ER x 20 with 5 lbs Lateral band walks with yellow loop x 5 laps at back counter Heel raises x 20 Standing hamstring curl with 5 lbs 2 x 10 each Standing hip abduction and extension with 5 lbs 2 x 10 each Leg Press (seat 6) with 50 lbs x 20 Rocker board x 2 min for ankle mobility Step up on 6" step x 10 each LE fwd, then x 10 each LE lateral   10/15/23 Nustep x 5 min level 5 (PT present to discuss status) Seated ball squeeze with purple ball x 20 Seated LAQ with 4# 2 x 10 with ball squeeze Seated clam with red loop x 20 Sit to stand x 10 with 5 lb kb Squat to chair x 10 with 5 lb kb Standing lateral band walks with yellow loop 5 laps at barre Standing heel raises x 20 Seated hamstring curl x 20 each with green tband Step ups 2 x 10 on 6" Lateral step ups 2 x 10 on 6" step (vc's to keep knee over ankle to reduce shearing force) Leg Press (seat 6) with 45 lbs x 20 SLS cone touches 3 x 10 each LE (cone on 8" step on its tall side with balance pad on top of that) Hip matrix abduction and extension 2 x 10 with 25# both  09/10/23 Initial eval completed and initiated HEP    PATIENT EDUCATION:  Education details: Initiated HEP Person educated: Patient Education method: Programmer, multimedia, Facilities manager, and Handouts Education comprehension: verbalized understanding, returned demonstration, and verbal cues required  HOME EXERCISE PROGRAM: Access Code: [email or web address] URL: [email or web address] Date: 09/24/2023 Prepared by: Mikey Kirschner  Exercises - Supine  Quadricep Sets  - 1 x daily - 7 x weekly - 2 sets - 10 reps - Small Range Straight Leg Raise  - 1 x daily - 7 x weekly - 2 sets - 10 reps - Supine Knee Extension Strengthening  - 1 x daily - 7 x weekly - 2 sets - 10 reps - Seated Long Arc Quad  - 1 x daily - 7 x weekly - 2 sets - 10 reps - Sit to Stand Without Arm Support  - 1 x daily - 7 x weekly - 2 sets - 10 reps - Side Stepping with Resistance at Ankles and Counter Support  - 1 x daily - 7 x weekly - 3 sets - 10 reps  ASSESSMENT:  CLINICAL IMPRESSION: Andrey had some right low back pain after last visit.  She has a trunk shift and this may have originated from some of the new standing exercises such as hamstring curl.  We dropped the resistance on these exercises today and she was able to do those without increase in those right low back symptoms.   She would benefit from continued skilled PT for quad rehab, hip strengthening and modalities to control pain.    OBJECTIVE IMPAIRMENTS: Abnormal gait, decreased balance, decreased mobility, difficulty walking, decreased ROM, decreased strength, increased fascial restrictions, increased muscle spasms, impaired flexibility, postural dysfunction, and pain.   ACTIVITY LIMITATIONS: carrying, lifting, bending, sitting, standing, squatting, sleeping, stairs, transfers, bathing, toileting, dressing, hygiene/grooming, and caring for others  PARTICIPATION LIMITATIONS: meal prep, cleaning, laundry, driving, shopping, community activity, yard work, and church  PERSONAL FACTORS:  Age, Fitness, Time since onset of injury/illness/exacerbation, and 1-2 comorbidities: hx scarlett fever and brain tumor  are also affecting patient's functional outcome.   REHAB POTENTIAL: Good  CLINICAL DECISION MAKING: Stable/uncomplicated  EVALUATION COMPLEXITY: Low   GOALS: Goals reviewed with patient? Yes  SHORT TERM GOALS: Target date: 10/08/2023  Pain report to be no greater than 4/10  Baseline: Goal status: MET  10/06/23  2.  Patient will be independent with initial HEP  Baseline:  Goal status: MET 10/06/23  LONG TERM GOALS: Target date: 11/05/2023   Pain report to be no greater than 4/10  Baseline:  Goal status: MET Partially  2.  Patient to be independent with advanced HEP  Baseline:  Goal status: In Progress  3.  Patient to be able to stand or walk for at least 15 min without leg pain  Baseline:  Goal status: In Progress  4.  Patient to be able to ascend and descend steps without pain or no greater than 2/10  Baseline:  Goal status: MET 10/22/23  5.  Patient to be able to bend, stoop and squat with pain no greater than 2/10  Baseline:  Goal status: INITIAL  6.  LEFS score to improve by 2-4 points Baseline:  Goal status: INITIAL   PLAN:  PT FREQUENCY: 2x/week  PT DURATION: 8 weeks  PLANNED INTERVENTIONS: 97110-Therapeutic exercises, 97530- Therapeutic activity, [id number]- Neuromuscular re-education, 97535- Self Care, 16109- Manual therapy, [id number]- Gait training, [phone]- Canalith repositioning, [id number]- Aquatic Therapy, 97760- Splinting, 97014- Electrical stimulation (unattended), [id number]- Electrical stimulation (manual), 97016- Vasopneumatic device, [id number]- Ultrasound, [id number]- Ionotophoresis 4mg /ml Dexamethasone, Patient/Family education, Balance training, Stair training, Taping, Dry Needling, Joint mobilization, Scar mobilization, Compression bandaging, Vestibular training, Visual/preceptual remediation/compensation, DME instructions, Cryotherapy, and Moist heat  PLAN FOR NEXT SESSION:  Nustep, continue to increase resistance, functional strengthening, eccentric step downs with 4" to tolerance, increase resistance with all quad rehab, glut med strengthening, hip extension strength   Lonetta Blassingame B. Alexandro Line, PT 10/27/23 7:18 PM [facility] [address], [address] [place], [place] [address] Phone # [phone] Fax [phone]

## 2023-10-29 ENCOUNTER — Ambulatory Visit: Payer: Medicare Other

## 2023-10-29 DIAGNOSIS — R262 Difficulty in walking, not elsewhere classified: Secondary | ICD-10-CM | POA: Diagnosis not present

## 2023-10-29 DIAGNOSIS — M25561 Pain in right knee: Secondary | ICD-10-CM | POA: Diagnosis not present

## 2023-10-29 DIAGNOSIS — M6281 Muscle weakness (generalized): Secondary | ICD-10-CM

## 2023-10-29 DIAGNOSIS — R293 Abnormal posture: Secondary | ICD-10-CM

## 2023-10-29 DIAGNOSIS — G8929 Other chronic pain: Secondary | ICD-10-CM

## 2023-10-29 DIAGNOSIS — R252 Cramp and spasm: Secondary | ICD-10-CM | POA: Diagnosis not present

## 2023-10-29 DIAGNOSIS — M25562 Pain in left knee: Secondary | ICD-10-CM | POA: Diagnosis not present

## 2023-10-29 NOTE — Therapy (Signed)
 OUTPATIENT PHYSICAL THERAPY LOWER EXTREMITY TREATMENT PHYSICAL THERAPY DISCHARGE SUMMARY  Visits from Start of Care: 11  Current functional level related to goals / functional outcomes: See below   Remaining deficits: See below   Education / Equipment: See below   Patient agrees to discharge. Patient goals were met. Patient is being discharged due to meeting the stated rehab goals.    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   Duodenal ulcer 1961   Hepatitis 1951   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   Past Surgical History:  Procedure Laterality Date   APPLICATION OF CRANIAL NAVIGATION N/A 06/02/2022   Procedure: APPLICATION OF CRANIAL NAVIGATION;  Surgeon: Jadene Pierini, MD;  Location: [facility] OR;  Service: Neurosurgery;  Laterality: N/A;   CRANIOTOMY Left 06/02/2022   Procedure: Left craniotomy for tumor resection;  Surgeon: Jadene Pierini, MD;  Location: [facility] OR;  Service: Neurosurgery;  Laterality: Left;  RM 20   DIAGNOSTIC LAPAROSCOPY  1978   looking for fallopian tube blockage   HAND SURGERY Left    tumor removed between 1 & 2 nd digits   TONSILLECTOMY  1954   Patient Active Problem List   Diagnosis Date Noted   Meningioma, recurrent of brain (HCC) 09/29/2023   Mild hyperlipidemia 09/01/2023   Bilateral primary osteoarthritis of knee 09/01/2023   Osteopenia 08/17/2022   Brain tumor (HCC) 06/02/2022    PCP: Thana Ates, MD  REFERRING PROVIDER: Rodolph Bong, MD  REFERRING DIAG:    M25.561,M25.562,G89.29 (ICD-10-CM) - Chronic pain of both knees  M81.0 (ICD-10-CM) - Age-related 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  No back or knee pain.  She feels good about continuing on her own while she undergoes radiation for her benign brain tumor.    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  Removed one year ago but surgeon could not get all of tumor due to some border attachment to the brain tissue.  It is beginning to grow again and she will likely be having radiation vs surgery this time.   PAIN:  10/22/23  Are you having pain? Yes: NPRS scale: 0/10 at rest , 7/10 at worst Pain location: bilateral knees, also low back Pain description: aching Aggravating factors: start up, stairs Relieving factors: nothing  PRECAUTIONS: None  RED FLAGS: None   WEIGHT BEARING RESTRICTIONS: No  FALLS:  Has patient fallen in last 6 months? No  LIVING ENVIRONMENT: Lives with: lives with their family  Lives in: House/apartment Stairs: Yes: Internal: 12 steps; on right going up and External: 2 steps; none Has following equipment at home: None  OCCUPATION: Desk job   PLOF: Independent, Independent with basic ADLs, Independent with household mobility without device, Independent with community mobility without device, Independent with homemaking with ambulation, Independent with gait, and Independent with transfers  PATIENT GOALS: To be able avoid losing more bone due to osteoporosis.    NEXT MD VISIT:  Evaluate and Treat for bilat knee  pain and osteoporosis 1-2 times per week for 4-6 weeks.   Decrease pain, increase strength, flexibility, function, and range of motion.  Modalities may include, traction, ionto, phono, stim, and dry needling prn.  OBJECTIVE:  Note: Objective measures were completed at Evaluation unless otherwise noted.  DIAGNOSTIC FINDINGS: Images not read yet but images show pronounced varus bilaterally with limited tibiofemoral space on both (Left >Right)  PATIENT SURVEYS:  LEFS 30/80  Forgot to re-take but patient denies any limitations functionally   COGNITION: Overall cognitive status: Within functional limits for tasks assessed     SENSATION: WFL  POSTURE:  Pronounced varus deformity bilateral LE's Left > Right  PALPATION: Moderate global crepitus right knee, fairly isolated patellofemoral crepitus on left.   LOWER EXTREMITY ROM:  Lacking approx 10 degrees of full extension bilateral knees, otherwise ROM is [facility]  Extension ROM has improved to approx -5 degrees  LOWER EXTREMITY MMT:  Hips generally 4/5 with exception of hip ER which was 4-/5 bilaterally,  hip extension 4-/5.    10/29/23:  Hips generally 4+/5 with exception of hip ER which was 4+/5 bilaterally,  hip extension 4+/5.  LOWER EXTREMITY SPECIAL TESTS:  Knee special tests: Apley's compression test: positive  and Patella tap test (ballotable patella): negative  FUNCTIONAL TESTS:  5 times sit to stand: 16.85 sec Timed up and go (TUG): 12.54 sec   10/29/23:  5 times sit to stand: 11.08 sec Timed up and go (TUG): 10.38 sec GAIT: Distance walked: 30 feet Assistive device utilized: None Level of assistance: Modified independence Comments: slow, guarded, pronounced varus bilateral (Left > Right)                                                                                                                                TREATMENT DATE:  10/29/23 DC assessment completed Reviewed HEP and educated on how to progress,  activities that she may need to avoid while going through radiation, suggested that she discuss what she has been doing in PT and inquire about what she is allowed to do while going through the radiation.      10/27/23 Nustep x 5 min level 5 (PT present to discuss status) Sit to stand x 10 with 10 lb kb Squat to mat table x 10 with 5 lb kb Seated LAQ x 20 with 6 lbs Seated march x 20 with 6 lbs Seated hip ER x 20 with 6 lbs Seated  clam with red loop x 20 Lateral band walks with yellow loop x 5 laps at back counter Step ups 2 x 10 on 6" Lateral step ups 2 x 10 on 6" step (vc's to keep knee over ankle to reduce shearing force) Lateral eccentric heel tap x 10 each LE Leg Press (seat 6) with 45 lbs x 20 Standing hamstring curl with 0 lbs 2 x 10 each   10/22/23 Nustep x 5 min level 5 (PT present to discuss status) Sit to stand x 10 with 10 lb kb Squat to mat table x 10 with 5 lb kb Seated LAQ x 20 with 5 lbs Seated march x 20 with 5 lbs Seated hip ER x 20 with 5 lbs Lateral band walks with yellow loop x 5 laps at back counter Standing hamstring curl with 0 lbs 2 x 10 each Standing hip abduction and extension with 0 lbs 2 x 10 each Heel raises x 20 Leg Press (seat 6) with 45 lbs x 20  10/15/23 Nustep x 5 min level 5 (PT present to discuss status) Sit to stand x 10 with 10 lb kb Squat to chair x 10 with 5 lb kb Seated LAQ x 20 with 5 lbs Seated march x 20 with 5 lbs Seated hip ER x 20 with 5 lbs Lateral band walks with yellow loop x 5 laps at back counter Heel raises x 20 Standing hamstring curl with 5 lbs 2 x 10 each Standing hip abduction and extension with 5 lbs 2 x 10 each Leg Press (seat 6) with 50 lbs x 20 Rocker board x 2 min for ankle mobility Step up on 6" step x 10 each LE fwd, then x 10 each LE lateral    09/10/23 Initial eval completed and initiated HEP    PATIENT EDUCATION:  Education details: Initiated HEP Person educated: Patient Education method: Programmer, multimedia,  Facilities manager, and Handouts Education comprehension: verbalized understanding, returned demonstration, and verbal cues required  HOME EXERCISE PROGRAM: Access Code: [email or web address] URL: [email or web address] Date: 09/24/2023 Prepared by: Mikey Kirschner  Exercises - Supine Quadricep Sets  - 1 x daily - 7 x weekly - 2 sets - 10 reps - Small Range Straight Leg Raise  - 1 x daily - 7 x weekly - 2 sets - 10 reps - Supine Knee Extension Strengthening  - 1 x daily - 7 x weekly - 2 sets - 10 reps - Seated Long Arc Quad  - 1 x daily - 7 x weekly - 2 sets - 10 reps - Sit to Stand Without Arm Support  - 1 x daily - 7 x weekly - 2 sets - 10 reps - Side Stepping with Resistance at Ankles and Counter Support  - 1 x daily - 7 x weekly - 3 sets - 10 reps  ASSESSMENT:  CLINICAL IMPRESSION: Delainey has met    OBJECTIVE IMPAIRMENTS: Abnormal gait, decreased balance, decreased mobility, difficulty walking, decreased ROM, decreased strength, increased fascial restrictions, increased muscle spasms, impaired flexibility, postural dysfunction, and pain.   ACTIVITY LIMITATIONS: carrying, lifting, bending, sitting, standing, squatting, sleeping, stairs, transfers, bathing, toileting, dressing, hygiene/grooming, and caring for others  PARTICIPATION LIMITATIONS: meal prep, cleaning, laundry, driving, shopping, community activity, yard work, and church  PERSONAL FACTORS: Age, Fitness, Time since onset of injury/illness/exacerbation, and 1-2 comorbidities: hx scarlett fever and brain tumor  are also affecting patient's functional outcome.   REHAB POTENTIAL: Good  CLINICAL DECISION MAKING: Stable/uncomplicated  EVALUATION COMPLEXITY: Low   GOALS: Goals reviewed with  patient? Yes  SHORT TERM GOALS: Target date: 10/08/2023  Pain report to be no greater than 4/10  Baseline: Goal status: MET 10/06/23  2.  Patient will be independent with initial HEP  Baseline:  Goal status: MET 10/06/23  LONG TERM  GOALS: Target date: 11/05/2023   Pain report to be no greater than 4/10  Baseline:  Goal status: MET 10/29/23  2.  Patient to be independent with advanced HEP  Baseline:  Goal status: MET 10/29/23  3.  Patient to be able to stand or walk for at least 15 min without leg pain  Baseline:  Goal status:MET 10/29/23  4.  Patient to be able to ascend and descend steps without pain or no greater than 2/10  Baseline:  Goal status: MET 10/22/23  5.  Patient to be able to bend, stoop and squat with pain no greater than 2/10  Baseline:  Goal status: MET 10/29/23  6.  LEFS score to improve by 2-4 points Baseline:  Goal status: not assessed but would likely have been met based on patients verbal comments on her increased function   PLAN:  PT FREQUENCY: 2x/week  PT DURATION: 8 weeks  PLANNED INTERVENTIONS: 97110-Therapeutic exercises, 97530- Therapeutic activity, [id number]- Neuromuscular re-education, 97535- Self Care, 40981- Manual therapy, [id number]- Gait training, [phone]- Canalith repositioning, [id number]- Aquatic Therapy, 97760- Splinting, 97014- Electrical stimulation (unattended), [id number]- Electrical stimulation (manual), [id number]- Vasopneumatic device, [id number]- Ultrasound, [id number]- Ionotophoresis 4mg /ml Dexamethasone, Patient/Family education, Balance training, Stair training, Taping, Dry Needling, Joint mobilization, Scar mobilization, Compression bandaging, Vestibular training, Visual/preceptual remediation/compensation, DME instructions, Cryotherapy, and Moist heat  PLAN FOR NEXT SESSION:  DC   Kiarrah Rausch B. Kimble Hitchens, PT 10/29/23 4:02 PM [facility] [address], [address] [place], [place] [address] Phone # [phone] Fax [phone]

## 2023-10-30 DIAGNOSIS — Z510 Encounter for antineoplastic radiation therapy: Secondary | ICD-10-CM | POA: Insufficient documentation

## 2023-10-30 DIAGNOSIS — D329 Benign neoplasm of meninges, unspecified: Secondary | ICD-10-CM | POA: Diagnosis not present

## 2023-10-30 DIAGNOSIS — D320 Benign neoplasm of cerebral meninges: Secondary | ICD-10-CM | POA: Insufficient documentation

## 2023-11-03 ENCOUNTER — Telehealth: Payer: Self-pay | Admitting: Radiation Therapy

## 2023-11-03 NOTE — Telephone Encounter (Signed)
 I called to check in with Annette Wilson and see if she has any questions about her first radiation treatment scheduled tomorrow. We reviewed what to expect and what she should wear. Annette Wilson was thankful for my call and information.   Jalene Mullet R.T.(R)(T) Radiation Special Procedures Lead

## 2023-11-04 ENCOUNTER — Ambulatory Visit
Admission: RE | Admit: 2023-11-04 | Discharge: 2023-11-04 | Disposition: A | Discharge status: Home / Self Care | Source: Ambulatory Visit | Attending: Radiation Oncology | Admitting: Radiation Oncology

## 2023-11-04 ENCOUNTER — Other Ambulatory Visit: Payer: Self-pay

## 2023-11-04 DIAGNOSIS — Z510 Encounter for antineoplastic radiation therapy: Secondary | ICD-10-CM | POA: Diagnosis not present

## 2023-11-04 DIAGNOSIS — D320 Benign neoplasm of cerebral meninges: Secondary | ICD-10-CM | POA: Diagnosis not present

## 2023-11-04 DIAGNOSIS — D329 Benign neoplasm of meninges, unspecified: Secondary | ICD-10-CM | POA: Diagnosis not present

## 2023-11-04 LAB — RAD ONC ARIA SESSION SUMMARY
Course Elapsed Days: 0
Plan Fractions Treated to Date: 1
Plan Prescribed Dose Per Fraction: 5 Gy
Plan Total Fractions Prescribed: 5
Plan Total Prescribed Dose: 25 Gy
Reference Point Dosage Given to Date: 5 Gy
Reference Point Session Dosage Given: 5 Gy
Session Number: 1

## 2023-11-04 NOTE — Progress Notes (Addendum)
 Nurse monitoring complete status post radiation of her SRS treatments. Patient without complaints. Patient denies new or worsening neurologic symptoms. Vitals stable. Instructed patient to avoid strenuous activity for the next 24 hours. Instructed patient to call [phone] with needs related to treatment after hours or over the weekend. Patient verbalized understanding   Annette Wilson* rested with [place] for 15 minutes following SRS treatment.  Patient denies headache, dizziness, nausea, diplopia or ringing in the ears. Denies fatigue. Patient without complaints. Understands to avoid strenuous activity for the next 24 hours and call [phone] with needs  Headaches: None Dizziness: None Nausea:None Diplopia: None  Temp:97.4 B/P: 145/64,  Pulse 56 Spo2:100%  Respirations:18  Patient denies any pain and is resting comfortably. Encouraged patient to call the clinic for any questions or concerns.

## 2023-11-05 NOTE — Op Note (Signed)
 Name: Annette Wilson    MRN: [id number]   Date: 11/04/2023    DOB: 08/20/1945   STEREOTACTIC RADIOSURGERY OPERATIVE NOTE  PRE-OPERATIVE DIAGNOSIS:  meningioma  POST-OPERATIVE DIAGNOSIS:  Same  PROCEDURE:  Stereotactic Radiosurgery  SURGEON:  Monia Pouch, DO  RADIATION ONCOLOGIST: Dr. Basilio Cairo  TECHNIQUE:  The patient underwent a radiation treatment planning session in the radiation oncology simulation suite under the care of the radiation oncology physician and physicist.  I participated closely in the radiation treatment planning afterwards. The patient underwent planning CT which was fused to 3T high resolution MRI with 1 mm axial slices.  These images were fused on the planning system.  We contoured the gross target volumes and subsequently expanded this to yield the Planning Target Volume. I actively participated in the planning process.  I helped to define and review the target contours and also the contours of the optic pathway, eyes, brainstem and selected nearby organs at risk.  All the dose constraints for critical structures were reviewed and compared to AAPM Task Group 101.  The prescription dose conformity was reviewed.  I approved the plan electronically.    Accordingly, Sherlyn Lick  was brought to the TrueBeam stereotactic radiation treatment linac and placed in the custom immobilization mask.  The patient was aligned according to the IR fiducial markers with BrainLab Exactrac, then orthogonal x-rays were used in ExacTrac with the 6DOF robotic table and the shifts were made to align the patient.  Jaquila Whalen-Levitt received stereotactic radiosurgery to a prescription dose of 5Gy uneventfully for one fraction of 5 planned fractions for a planned total of 25Gy.    The detailed description of the procedure is recorded in the radiation oncology procedure note.  I was present for the duration of the procedure.  DISPOSITION:   Following delivery, the patient was transported  to nursing in stable condition and monitored for possible acute effects to be discharged to home in stable condition with follow-up in one month.  Monia Pouch, DO [facility]

## 2023-11-06 ENCOUNTER — Encounter: Payer: Self-pay | Admitting: Radiation Oncology

## 2023-11-06 ENCOUNTER — Other Ambulatory Visit: Payer: Self-pay

## 2023-11-06 ENCOUNTER — Ambulatory Visit
Admission: RE | Admit: 2023-11-06 | Discharge: 2023-11-06 | Disposition: A | Discharge status: Home / Self Care | Source: Ambulatory Visit | Attending: Radiation Oncology | Admitting: Radiation Oncology

## 2023-11-06 VITALS — BP 148/80 | HR 52 | Temp 97.7°F | Resp 17 | Wt 152.0 lb

## 2023-11-06 DIAGNOSIS — Z510 Encounter for antineoplastic radiation therapy: Secondary | ICD-10-CM | POA: Diagnosis not present

## 2023-11-06 DIAGNOSIS — D329 Benign neoplasm of meninges, unspecified: Secondary | ICD-10-CM | POA: Diagnosis not present

## 2023-11-06 DIAGNOSIS — D320 Benign neoplasm of cerebral meninges: Secondary | ICD-10-CM

## 2023-11-06 LAB — RAD ONC ARIA SESSION SUMMARY
Course Elapsed Days: 2
Plan Fractions Treated to Date: 2
Plan Prescribed Dose Per Fraction: 5 Gy
Plan Total Fractions Prescribed: 5
Plan Total Prescribed Dose: 25 Gy
Reference Point Dosage Given to Date: 10 Gy
Reference Point Session Dosage Given: 5 Gy
Session Number: 2

## 2023-11-06 NOTE — Progress Notes (Addendum)
 Nurse monitoring complete status post radiation of her SRS treatments. Patient without complaints. Patient denies new or worsening neurologic symptoms. Vitals stable. Instructed patient to avoid strenuous activity for the next 24 hours. (Instructed patient to not miss any of her decadron doses). Instructed patient to call [phone] with needs related to treatment after hours or over the weekend. Patient verbalized understanding   *Annette Wilson rested with [place] for 15 minutes following SRS treatment.  Patient denies headache, dizziness, nausea, diplopia or ringing in the ears. Denies fatigue. Patient without complaints. Understands to avoid strenuous activity for the next 24 hours and call [phone] with needs  Vital signs were within normal range Temp 97.7 BP 148/80 Pulse 52 Spo2% 100% Respirations 17

## 2023-11-06 NOTE — Addendum Note (Signed)
 Encounter addended by: Mohamed-Medani, Rodolph Bong, RN on: 11/06/2023 12:57 PM  Actions taken: Vitals modified

## 2023-11-09 ENCOUNTER — Other Ambulatory Visit: Payer: Self-pay

## 2023-11-09 ENCOUNTER — Ambulatory Visit
Admission: RE | Admit: 2023-11-09 | Discharge: 2023-11-09 | Disposition: A | Discharge status: Home / Self Care | Source: Ambulatory Visit | Attending: Radiation Oncology | Admitting: Radiation Oncology

## 2023-11-09 ENCOUNTER — Ambulatory Visit

## 2023-11-09 VITALS — BP 138/67 | HR 52 | Temp 97.6°F | Resp 18 | Ht 63.0 in

## 2023-11-09 DIAGNOSIS — D320 Benign neoplasm of cerebral meninges: Secondary | ICD-10-CM

## 2023-11-09 DIAGNOSIS — Z510 Encounter for antineoplastic radiation therapy: Secondary | ICD-10-CM | POA: Diagnosis not present

## 2023-11-09 DIAGNOSIS — D329 Benign neoplasm of meninges, unspecified: Secondary | ICD-10-CM | POA: Diagnosis not present

## 2023-11-09 LAB — RAD ONC ARIA SESSION SUMMARY
Course Elapsed Days: 5
Plan Fractions Treated to Date: 3
Plan Prescribed Dose Per Fraction: 5 Gy
Plan Total Fractions Prescribed: 5
Plan Total Prescribed Dose: 25 Gy
Reference Point Dosage Given to Date: 15 Gy
Reference Point Session Dosage Given: 5 Gy
Session Number: 3

## 2023-11-09 NOTE — Progress Notes (Addendum)
 Nurse monitoring complete status post radiation of  SRS treatments. Patient without complaints. Patient denies new or worsening neurologic symptoms. Vitals stable. Instructed patient to avoid strenuous activity for the next 24 hours. (Instructed patient to not miss any of her decadron doses). Instructed patient to call [phone] with needs related to treatment after hours or over the weekend. Patient verbalized understanding   Annette Wilson rested with us  for 15 minutes following SRS treatment.  Patient denies headache, dizziness, nausea, diplopia or ringing in the ears. Patient without complaints. Understands to avoid strenuous activity for the next 24 hours and call [phone] with needs  Patient has a little bit of fatigue but otherwise she is doing well.  BP 138/67 (BP Location: Left Arm, Patient Position: Sitting, Cuff Size: Normal)   Pulse (!) 52   Temp 97.6 F (36.4 C)   Resp 18   Ht 5\' 3"  (1.6 m)   SpO2 98%   BMI 26.93 kg/m   Wt Readings from Last 3 Encounters:  11/06/23 152 lb (68.9 kg)  11/04/23 152 lb 6.4 oz (69.1 kg)  10/26/23 152 lb 3.2 oz (69 kg)

## 2023-11-11 ENCOUNTER — Telehealth: Payer: Self-pay | Admitting: Radiation Oncology

## 2023-11-11 ENCOUNTER — Other Ambulatory Visit: Payer: Self-pay | Admitting: Radiation Therapy

## 2023-11-11 ENCOUNTER — Ambulatory Visit
Admission: RE | Admit: 2023-11-11 | Discharge: 2023-11-11 | Disposition: A | Discharge status: Home / Self Care | Source: Ambulatory Visit | Attending: Radiation Oncology | Admitting: Radiation Oncology

## 2023-11-11 ENCOUNTER — Other Ambulatory Visit: Payer: Self-pay

## 2023-11-11 DIAGNOSIS — D329 Benign neoplasm of meninges, unspecified: Secondary | ICD-10-CM

## 2023-11-11 DIAGNOSIS — Z510 Encounter for antineoplastic radiation therapy: Secondary | ICD-10-CM | POA: Diagnosis not present

## 2023-11-11 DIAGNOSIS — D320 Benign neoplasm of cerebral meninges: Secondary | ICD-10-CM | POA: Diagnosis not present

## 2023-11-11 LAB — RAD ONC ARIA SESSION SUMMARY
Course Elapsed Days: 7
Plan Fractions Treated to Date: 4
Plan Prescribed Dose Per Fraction: 5 Gy
Plan Total Fractions Prescribed: 5
Plan Total Prescribed Dose: 25 Gy
Reference Point Dosage Given to Date: 20 Gy
Reference Point Session Dosage Given: 5 Gy
Session Number: 4

## 2023-11-11 NOTE — Progress Notes (Signed)
 Annette Wilson rested with us  for 15 minutes following her SRS treatment.  Patient denies headache, dizziness, nausea, diplopia or ringing in the ears. Denies fatigue. Patient without complaints. Understands to avoid strenuous activity for the next 24 hours and call [phone] with needs.   BP (!) 152/80 (BP Location: Left Arm)   Pulse (!) 50   Temp 98 F (36.7 C) (Oral)   Resp 18   SpO2 99%    Annette Cowper RN, BSN

## 2023-11-11 NOTE — Telephone Encounter (Signed)
 4/16 Referral/MD's notes faxed to Neuro-Oncology-Dr. Boris Byars office.

## 2023-11-11 NOTE — Progress Notes (Signed)
 Nurse monitoring complete status post  of  SRS treatments. Patient without complaints. Patient denies new or worsening neurologic symptoms. Vitals stable. Instructed patient to avoid strenuous activity for the next 24 hours. (Instructed patient to not miss any of her decadron  doses). Instructed patient to call [phone] with needs related to treatment after hours or over the weekend. Patient verbalized understanding   Annette Wilson rested with us  for 15 minutes following SRS treatment.  Patient denies headache, dizziness, nausea, diplopia or ringing in the ears. Patient is having slight fatigue. Patient without complaints. Understands to avoid strenuous activity for the next 24 hours and call [phone] with needs BP (!) 149/49 (BP Location: Left Arm, Cuff Size: Normal)   Pulse (!) 54   Temp 97.7 F (36.5 C) (Oral)   Wt 152 lb (68.9 kg)   SpO2 100%   BMI 26.93 kg/m   Wt Readings from Last 3 Encounters:  11/13/23 152 lb (68.9 kg)  11/06/23 152 lb (68.9 kg)  11/04/23 152 lb 6.4 oz (69.1 kg)

## 2023-11-13 ENCOUNTER — Ambulatory Visit
Admission: RE | Admit: 2023-11-13 | Discharge: 2023-11-13 | Disposition: A | Discharge status: Home / Self Care | Source: Ambulatory Visit | Attending: Radiation Oncology | Admitting: Radiation Oncology

## 2023-11-13 ENCOUNTER — Other Ambulatory Visit: Payer: Self-pay

## 2023-11-13 VITALS — BP 149/49 | HR 54 | Temp 97.7°F | Wt 152.0 lb

## 2023-11-13 DIAGNOSIS — D320 Benign neoplasm of cerebral meninges: Secondary | ICD-10-CM

## 2023-11-13 DIAGNOSIS — D329 Benign neoplasm of meninges, unspecified: Secondary | ICD-10-CM | POA: Diagnosis not present

## 2023-11-13 DIAGNOSIS — Z510 Encounter for antineoplastic radiation therapy: Secondary | ICD-10-CM | POA: Diagnosis not present

## 2023-11-13 LAB — RAD ONC ARIA SESSION SUMMARY
Course Elapsed Days: 9
Plan Fractions Treated to Date: 5
Plan Prescribed Dose Per Fraction: 5 Gy
Plan Total Fractions Prescribed: 5
Plan Total Prescribed Dose: 25 Gy
Reference Point Dosage Given to Date: 25 Gy
Reference Point Session Dosage Given: 5 Gy
Session Number: 5

## 2023-11-16 NOTE — Radiation Completion Notes (Signed)
 Patient Name: Annette Wilson, Annette Wilson MRN: [id number] Date of Birth: 04-Nov-1945 Referring Physician: Denyce Flank, M.D. Date of Service: 2023-11-16 Radiation Oncologist: Colie Dawes, M.D. 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 Intent: Curative     ==========DELIVERED PLANS==========  First Treatment Date: 2023-11-04 Last Treatment Date: 2023-11-13   Plan Name: Brain_SRS Site: Brain Technique: SBRT/SRT-IMRT Mode: Photon Dose Per Fraction: 5 Gy Prescribed Dose (Delivered / Prescribed): 25 Gy / 25 Gy Prescribed Fxs (Delivered / Prescribed): 5 / 5     ==========ON TREATMENT VISIT DATES========== 2023-11-04, 2023-11-06, 2023-11-09, 2023-11-09, 2023-11-11, 2023-11-13     ==========UPCOMING VISITS==========       ==========APPENDIX - ON TREATMENT VISIT NOTES==========   See weekly On Treatment Notes in Epic for details in the Media tab (listed as Progress notes on the On Treatment Visit Dates listed above).

## 2023-12-07 ENCOUNTER — Encounter: Payer: Self-pay | Admitting: Radiology

## 2023-12-07 NOTE — Progress Notes (Signed)
 Annette Wilson was contacted today for a phone call follow up post radiation to the brain and to receive     They completed their radiation on: 2023-11-13 [facility]  Does the patient complain of any of the following: Headache: Denies Visual Changes: Denies  Hearing Changes: Denies any ringing in the ears. Nausea: Denies Vomiting: Denies Balance or coordination issues: Denies Memory issues: Denies  Is the patient currently on a Decadron  regimen? : Denies  Additional comments if applicable:Patient is without complaints. She understands to call [phone] with any needs.

## 2023-12-08 ENCOUNTER — Ambulatory Visit
Admission: RE | Admit: 2023-12-08 | Discharge: 2023-12-08 | Disposition: A | Discharge status: Home / Self Care | Source: Ambulatory Visit | Attending: Radiology | Admitting: Radiology

## 2023-12-08 DIAGNOSIS — D320 Benign neoplasm of cerebral meninges: Secondary | ICD-10-CM

## 2023-12-08 NOTE — Progress Notes (Signed)
 Radiation Oncology         [phone]) [phone] ________________________________  Name: Annette Wilson MRN: [id number]  Date: 12/08/2023  DOB: [birth year]  Follow-Up Visit Note - Conducted via telephone at patient request.   I spoke with the patient to conduct this consult visit via telephone. The patient was notified in advance and was offered an in person or telemedicine meeting to allow for face to face communication but instead preferred to proceed with a telephone consult.  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         The patient reports to be doing well overall today. She states her energy levels have returned within 2 weeks after completion of her radiation. She notes some hair thinning within the treatment field, but otherwise denies any aphagia, headaches, nausea, or changes in her vision or hearing.     ALLERGIES:  is allergic to alpha-gal, aspirin, cefadroxil, cephalosporins, codeine, and penicillins.  Meds: Current Outpatient Medications  Medication Sig Dispense Refill   clobetasol (OLUX) 0.05 % topical foam  (Patient not taking: Reported on 09/29/2023)     EPINEPHrine  0.3 mg/0.3 mL IJ SOAJ injection Inject 0.3 mg into the muscle as needed for anaphylaxis.     estradiol (ESTRACE) 0.1 MG/GM vaginal  cream Place 1 Applicatorful vaginally 2 (two) times a week.     ketoconazole (NIZORAL) 2 % shampoo SHAMPOO WITH SMALL AMOUNT TWICE WEEKLY     triamcinolone cream (KENALOG) 0.1 % Apply 1 a small amount to skin twice a day apply to active areas as needed     Vitamin D , Ergocalciferol , (DRISDOL) 1.25 MG (50000 UNIT) CAPS capsule Take 50,000 Units by mouth once a week.     No current facility-administered medications for this encounter.    Physical Findings: Deferred, due to nature of telephone visit.   Lab Findings: Lab Results  Component Value Date   WBC 4.3 06/02/2022   HGB 12.5 06/02/2022   HCT 38.6 06/02/2022   MCV 92.1 06/02/2022   PLT 177 06/02/2022    @LASTCHEMISTRY @  Radiographic Findings: No results found.  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 She tolerated the treatment well and is not experiencing any residual side effects from treatment. We will continue with regular 6 month follow-up. She is scheduled for her next MRI of the brain on 04/21/2024. We will review her case in our tumor board and patient will meet with Dr. Mark Sil for follow-up. She was encouraged to call with any questions or concerns in the meantime.  We appreciate the opportunity to take part in this patient's care.   This encounter was conducted via telephone.  The patient has provided two factor identification and has given verbal consent for this type of encounter and has been advised  to only accept a meeting of this type in a secure network environment.  The time spent during this encounter was 20 minutes including preparation, discussion, and coordination of the patient's care  The attendants for this meeting include Julio Ohm PA-C and patient. During the encounter Julio Ohm PA-C was located at [facility] Radiation Oncology Department.  Patient was located  at home. _____________________________________    Julio Ohm, PA-C

## 2024-02-22 ENCOUNTER — Ambulatory Visit (INDEPENDENT_AMBULATORY_CARE_PROVIDER_SITE_OTHER)

## 2024-02-22 ENCOUNTER — Encounter: Payer: Self-pay | Admitting: Family Medicine

## 2024-02-22 ENCOUNTER — Ambulatory Visit (INDEPENDENT_AMBULATORY_CARE_PROVIDER_SITE_OTHER): Admitting: Family Medicine

## 2024-02-22 VITALS — BP 112/74 | HR 60 | Ht 63.0 in | Wt 150.0 lb

## 2024-02-22 DIAGNOSIS — M79662 Pain in left lower leg: Secondary | ICD-10-CM

## 2024-02-22 DIAGNOSIS — M7989 Other specified soft tissue disorders: Secondary | ICD-10-CM | POA: Diagnosis not present

## 2024-02-22 DIAGNOSIS — M1712 Unilateral primary osteoarthritis, left knee: Secondary | ICD-10-CM | POA: Diagnosis not present

## 2024-02-22 NOTE — Patient Instructions (Addendum)
 Thank you for coming in today.   Please get an Xray today before you leave   Please use Voltaren gel (Generic Diclofenac Gel) up to 4x daily for pain as needed.  This is available over-the-counter as both the name brand Voltaren gel and the generic diclofenac gel.   Recommend Tylenol  and relative rest.   Let us  know if things aren't improving and we can place a referral for formal PT.

## 2024-02-22 NOTE — Progress Notes (Signed)
   I, Leotis Batter, CMA acting as a scribe for Artist Lloyd, MD.  Annette Wilson is a 78 y.o. female who presents to [facility] today for L leg pain. Pt was previously seen by Dr. Lloyd on 10/13/23 for bilat knee pain.  Today, pt c/o L leg pain x 5 days. Pt locates pain to anterior aspect of the lower leg. Sx started while moving boxes around. Concerned for stress fx. Notes Sharp shooting pain into the lower leg, 9-10/10. Has tried rest, ice, and tylenol  with minimal relief. Sx worse with ambulation, ambulating with a limp today. Chronic LBP and L knee OA. Denies n/t.   Low back pain: chronic Radiating pain: anterior lower leg LE numbness/tingling: denies LE weakness: abnormal gait Aggravates: ambulation Treatments tried: rest, ice, Tylenol   Pertinent review of systems: No fevers or chills  Relevant historical information: History of an hemangioma recently had gamma knife radio treatment Knee arthritis  Exam:  BP 112/74   Pulse 60   Ht 5' 3 (1.6 m)   Wt 150 lb (68 kg)   SpO2 97%   BMI 26.57 kg/m  General: Well Developed, well nourished, and in no acute distress.   MSK: Left lower leg normal-appearing Mildly tender palpation anterior tibia both proximally and distally.  Some pain is reproduced with resisted foot dorsiflexion.  Strength is intact.  L-spine negative slump test lower extremity strength is intact.    Lab and Radiology Results  X-ray images left tib-fib obtained today personally and independently interpreted. DDD medial knee compartment otherwise tib-fib x-ray is unremarkable. Await formal radiology review    Assessment and Plan: 78 y.o. female with left anterior tibia pain after increasing activity quite a bit helping her daughter move.  Pain most likely due to tibialis anterior related pain.  Referred pain from medial knee arthritis could be a possibility.  Lumbar radiculopathy at L4 is much less likely but remains a  possibility.  Plan for home exercise program.  If not improving physical therapy referral can be ordered with a phone call or MyChart message.  Recommend Tylenol  and Voltaren gel.  Check back as needed.  PDMP not reviewed this encounter. Orders Placed This Encounter  Procedures   DG Tibia/Fibula Left    Standing Status:   Future    Number of Occurrences:   1    Expiration Date:   02/21/2025    Reason for Exam (SYMPTOM  OR DIAGNOSIS REQUIRED):   left lower leg pain    Preferred imaging location?:   [facility]   No orders of the defined types were placed in this encounter.    Discussed warning signs or symptoms. Please see discharge instructions. Patient expresses understanding.   The above documentation has been reviewed and is accurate and complete Artist Lloyd, M.D.

## 2024-02-28 ENCOUNTER — Ambulatory Visit: Payer: Self-pay | Admitting: Family Medicine

## 2024-02-28 NOTE — Progress Notes (Signed)
 Left tib fib xray shows some soft tissue swelling and some knee arthritis but no broken bone or anything significantly wrong with the shin bone.

## 2024-03-16 DIAGNOSIS — Z86018 Personal history of other benign neoplasm: Secondary | ICD-10-CM | POA: Diagnosis not present

## 2024-03-16 DIAGNOSIS — E559 Vitamin D deficiency, unspecified: Secondary | ICD-10-CM | POA: Diagnosis not present

## 2024-03-16 DIAGNOSIS — Z79899 Other long term (current) drug therapy: Secondary | ICD-10-CM | POA: Diagnosis not present

## 2024-03-16 DIAGNOSIS — M8589 Other specified disorders of bone density and structure, multiple sites: Secondary | ICD-10-CM | POA: Diagnosis not present

## 2024-03-16 DIAGNOSIS — M170 Bilateral primary osteoarthritis of knee: Secondary | ICD-10-CM | POA: Diagnosis not present

## 2024-03-16 DIAGNOSIS — R7303 Prediabetes: Secondary | ICD-10-CM | POA: Diagnosis not present

## 2024-03-16 DIAGNOSIS — Z0000 Encounter for general adult medical examination without abnormal findings: Secondary | ICD-10-CM | POA: Diagnosis not present

## 2024-03-16 DIAGNOSIS — E785 Hyperlipidemia, unspecified: Secondary | ICD-10-CM | POA: Diagnosis not present

## 2024-03-21 DIAGNOSIS — Z01419 Encounter for gynecological examination (general) (routine) without abnormal findings: Secondary | ICD-10-CM | POA: Diagnosis not present

## 2024-03-21 DIAGNOSIS — Z6826 Body mass index (BMI) 26.0-26.9, adult: Secondary | ICD-10-CM | POA: Diagnosis not present

## 2024-03-21 DIAGNOSIS — Z1231 Encounter for screening mammogram for malignant neoplasm of breast: Secondary | ICD-10-CM | POA: Diagnosis not present

## 2024-04-11 DIAGNOSIS — Z23 Encounter for immunization: Secondary | ICD-10-CM | POA: Diagnosis not present

## 2024-04-11 NOTE — Progress Notes (Signed)
  Subjective:    Patient ID: Annette Wilson is a 78 y.o. female here for a Flu Vaccine and Covid Vaccination visit.     Are you sick today?: No  Have you ever had a serious reaction to any vaccine in the past?: No (FlowSheet update)  Have you felt dizzy or faint before, during or after an immunization?: No (FlowSheet update)  Does the patient agree to be seated in a chair with arms or lie on the exam table?: Yes Does the patient agree to be observed for 15 minutes post-procedure to assess for reaction or fainting risk?: Yes Do you have any concerns receiving a vaccine in either arm (history of shoulder injury, mastectomy or other surgery?): No   Any patient-supplied  information was reviewed and discussed with the patient. : Annette Wilson as reviewed.  Has the VIS been reviewed? Please offer a paper copy of the VIS. Patient may decline: Yes (FlowSheet update)      Lifestyle: Annette Wilson has no history on file for tobacco use.      Objective:    Assessment/Plan:   Vaccine administered in accordance with [facility] guidelines.   Patient advised to contact [facility] if adverse event occurs.

## 2024-04-19 DIAGNOSIS — Z23 Encounter for immunization: Secondary | ICD-10-CM | POA: Diagnosis not present

## 2024-04-21 ENCOUNTER — Ambulatory Visit
Admission: RE | Admit: 2024-04-21 | Discharge: 2024-04-21 | Disposition: A | Discharge status: Home / Self Care | Source: Ambulatory Visit | Attending: Radiation Oncology | Admitting: Radiation Oncology

## 2024-04-21 DIAGNOSIS — D329 Benign neoplasm of meninges, unspecified: Secondary | ICD-10-CM

## 2024-04-21 DIAGNOSIS — D320 Benign neoplasm of cerebral meninges: Secondary | ICD-10-CM | POA: Diagnosis not present

## 2024-04-21 MED ORDER — GADOPICLENOL 0.5 MMOL/ML IV SOLN
7.0000 mL | Freq: Once | INTRAVENOUS | Status: AC | PRN
  Administered 2024-04-21: 7 mL via INTRAVENOUS

## 2024-04-25 ENCOUNTER — Inpatient Hospital Stay

## 2024-04-25 DIAGNOSIS — G936 Cerebral edema: Secondary | ICD-10-CM | POA: Insufficient documentation

## 2024-04-25 DIAGNOSIS — Z8711 Personal history of peptic ulcer disease: Secondary | ICD-10-CM | POA: Insufficient documentation

## 2024-04-25 DIAGNOSIS — Z880 Allergy status to penicillin: Secondary | ICD-10-CM | POA: Insufficient documentation

## 2024-04-25 DIAGNOSIS — Z885 Allergy status to narcotic agent status: Secondary | ICD-10-CM | POA: Insufficient documentation

## 2024-04-25 DIAGNOSIS — Z9089 Acquired absence of other organs: Secondary | ICD-10-CM | POA: Insufficient documentation

## 2024-04-25 DIAGNOSIS — Z923 Personal history of irradiation: Secondary | ICD-10-CM | POA: Insufficient documentation

## 2024-04-25 DIAGNOSIS — Z820 Family history of epilepsy and other diseases of the nervous system: Secondary | ICD-10-CM | POA: Insufficient documentation

## 2024-04-25 DIAGNOSIS — Z881 Allergy status to other antibiotic agents status: Secondary | ICD-10-CM | POA: Insufficient documentation

## 2024-04-25 DIAGNOSIS — Z79899 Other long term (current) drug therapy: Secondary | ICD-10-CM | POA: Insufficient documentation

## 2024-04-25 DIAGNOSIS — D320 Benign neoplasm of cerebral meninges: Secondary | ICD-10-CM | POA: Insufficient documentation

## 2024-04-25 DIAGNOSIS — Z886 Allergy status to analgesic agent status: Secondary | ICD-10-CM | POA: Insufficient documentation

## 2024-04-25 DIAGNOSIS — Z801 Family history of malignant neoplasm of trachea, bronchus and lung: Secondary | ICD-10-CM | POA: Insufficient documentation

## 2024-04-26 ENCOUNTER — Telehealth: Payer: Self-pay | Admitting: Internal Medicine

## 2024-04-26 ENCOUNTER — Inpatient Hospital Stay: Attending: Internal Medicine | Admitting: Internal Medicine

## 2024-04-26 VITALS — BP 157/73 | HR 63 | Temp 97.2°F | Resp 20 | Wt 151.2 lb

## 2024-04-26 DIAGNOSIS — Z9089 Acquired absence of other organs: Secondary | ICD-10-CM | POA: Diagnosis not present

## 2024-04-26 DIAGNOSIS — Z885 Allergy status to narcotic agent status: Secondary | ICD-10-CM | POA: Diagnosis not present

## 2024-04-26 DIAGNOSIS — Z820 Family history of epilepsy and other diseases of the nervous system: Secondary | ICD-10-CM | POA: Diagnosis not present

## 2024-04-26 DIAGNOSIS — Z880 Allergy status to penicillin: Secondary | ICD-10-CM

## 2024-04-26 DIAGNOSIS — Z79899 Other long term (current) drug therapy: Secondary | ICD-10-CM | POA: Diagnosis not present

## 2024-04-26 DIAGNOSIS — Z923 Personal history of irradiation: Secondary | ICD-10-CM | POA: Diagnosis not present

## 2024-04-26 DIAGNOSIS — Z886 Allergy status to analgesic agent status: Secondary | ICD-10-CM

## 2024-04-26 DIAGNOSIS — D320 Benign neoplasm of cerebral meninges: Secondary | ICD-10-CM | POA: Diagnosis not present

## 2024-04-26 DIAGNOSIS — Z881 Allergy status to other antibiotic agents status: Secondary | ICD-10-CM

## 2024-04-26 DIAGNOSIS — Z8711 Personal history of peptic ulcer disease: Secondary | ICD-10-CM

## 2024-04-26 DIAGNOSIS — G936 Cerebral edema: Secondary | ICD-10-CM

## 2024-04-26 DIAGNOSIS — Z801 Family history of malignant neoplasm of trachea, bronchus and lung: Secondary | ICD-10-CM

## 2024-04-26 NOTE — Progress Notes (Signed)
 [facility] at [facility] [address]  [place], [place] [address] [phone]   New Patient Evaluation  Date of Service: 04/26/24 Patient Name: Annette Wilson Patient MRN: [id number] Patient DOB: 20-Nov-1945 Provider: Arthea MARLA Manns, MD  Identifying Statement:  Annette Wilson is a 78 y.o. 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    Referring Provider: Dwight Trula SQUIBB, MD [address] [place],  [place] [address]  Oncologic History: 06/02/22: Left temporal craniotomy, resection with Dr. Cheryle; path demonstrates WHO 1 meningioma 11/13/23: Modest growth, completes 25/5 SRS with Dr. Izell   Biomarkers: n/a  History of Present Illness: The patient's records from the referring physician were obtained and reviewed and the patient interviewed to confirm this HPI.  Annette Wilson presents today for follow up after recent MRI brain.  She completed radiation in April for recurrent meningioma.  No neurologic complaints today.  Denies issues with memory, cognition, speech, or overall energy.  Prior to surgery in 2023 she did experience some episodes of speech arrest, but none since that time.  Fully independent with activities of daily living otherwise.  Medications: Current Outpatient Medications on File Prior to Visit  Medication Sig Dispense Refill   EPINEPHrine  0.3 mg/0.3 mL IJ SOAJ injection Inject 0.3 mg into the muscle as needed for anaphylaxis.     estradiol (ESTRACE) 0.1 MG/GM vaginal cream Place 1 Applicatorful vaginally 2 (two) times a week.     Vitamin D , Ergocalciferol , (DRISDOL) 1.25 MG (50000 UNIT) CAPS capsule Take 50,000 Units by mouth once a week.     No current facility-administered medications on file prior to visit.    Allergies:  Allergies  Allergen Reactions   Alpha-Gal Anaphylaxis and Hives   Aspirin Anaphylaxis    REACTION: throat swelling   Cefadroxil  Anaphylaxis   Cephalosporins    Codeine    Penicillins     Unknown reaction, childhood allergy   Past Medical History:  Past Medical History:  Diagnosis Date   Abscess 1954   developed abscesses after her tonsilectomy   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   Duodenal ulcer 1961   Hepatitis 1951   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   Past Surgical History:  Past Surgical History:  Procedure Laterality Date   APPLICATION OF CRANIAL NAVIGATION N/A 06/02/2022   Procedure: APPLICATION OF CRANIAL NAVIGATION;  Surgeon: Cheryle Debby LABOR, MD;  Location: [facility] OR;  Service: Neurosurgery;  Laterality: N/A;   CRANIOTOMY Left 06/02/2022   Procedure: Left craniotomy for tumor resection;  Surgeon: Cheryle Debby LABOR, MD;  Location: [facility] OR;  Service: Neurosurgery;  Laterality: Left;  RM 20   DIAGNOSTIC LAPAROSCOPY  1978   looking for fallopian tube blockage   HAND SURGERY Left    tumor removed between 1 & 2 nd digits   TONSILLECTOMY  1954   Social History:  Social History   Socioeconomic History   Marital status: Married    Spouse name: Prentice   Number of children: 2   Years of education: Not on file   Highest education level: Doctorate  Occupational History   Occupation: Interior and spatial designer  Tobacco Use   Smoking status: Never   Smokeless tobacco: Never  Vaping Use   Vaping status: Never Used  Substance and Sexual Activity   Alcohol use: Not Currently   Drug use: Never   Sexual activity: Yes  Other Topics Concern   Not on file  Social History  Narrative   Patient is left-handed. She lives with her husbanded in a 2 level home, master on the first floor. She drinks 3 cups of black tea a day. She has been unable to exercise to bi-lateral knee injuries.   Social Drivers of Corporate investment banker Strain: Not on file  Food Insecurity: No Food Insecurity (09/29/2023)   Hunger Vital Sign    Worried About Running Out of Food in the Last Year: Never true    Ran Out of Food in the Last Year: Never true   Transportation Needs: No Transportation Needs (09/29/2023)   PRAPARE - Administrator, Civil Service (Medical): No    Lack of Transportation (Non-Medical): No  Physical Activity: Not on file  Stress: Not on file  Social Connections: Not on file  Intimate Partner Violence: Not At Risk (09/29/2023)   Humiliation, Afraid, Rape, and Kick questionnaire    Fear of Current or Ex-Partner: No    Emotionally Abused: No    Physically Abused: No    Sexually Abused: No   Family History:  Family History  Problem Relation Age of Onset   Alzheimer's disease Mother 22   Lung cancer Father 12    Review of Systems: Constitutional: Doesn't report fevers, chills or abnormal weight loss Eyes: Doesn't report blurriness of vision Ears, nose, mouth, throat, and face: Doesn't report sore throat Respiratory: Doesn't report cough, dyspnea or wheezes Cardiovascular: Doesn't report palpitation, chest discomfort  Gastrointestinal:  Doesn't report nausea, constipation, diarrhea GU: Doesn't report incontinence Skin: Doesn't report skin rashes Neurological: Per HPI Musculoskeletal: Doesn't report joint pain Behavioral/Psych: Doesn't report anxiety  Physical Exam: Vitals:   04/26/24 1056 04/26/24 1105  BP: (!) 160/74 (!) 157/73  Pulse: 63   Resp: 20   Temp: (!) 97.2 F (36.2 C)   SpO2: 95%    KPS: 90. General: Alert, cooperative, pleasant, in no acute distress Head: Normal EENT: No conjunctival injection or scleral icterus.  Lungs: Resp effort normal Cardiac: Regular rate Abdomen: Non-distended abdomen Skin: No rashes cyanosis or petechiae. Extremities: No clubbing or edema  Neurologic Exam: Mental Status: Awake, alert, attentive to examiner. Oriented to self and environment. Language is fluent with intact comprehension.  Cranial Nerves: Visual acuity is grossly normal. Visual fields are full. Extra-ocular movements intact. No ptosis. Face is symmetric Motor: Tone and bulk are normal.  Power is full in both arms and legs. Reflexes are symmetric, no pathologic reflexes present.  Sensory: Intact to light touch Gait: Normal.   Labs: I have reviewed the data as listed    Component Value Date/Time   NA 139 06/02/2022 0829   K 3.6 06/02/2022 0829   CL 105 06/02/2022 0657   CO2 25 06/02/2022 0657   GLUCOSE 105 (H) 06/02/2022 0657   BUN 17 10/26/2023 1415   CREATININE 0.78 10/26/2023 1415   CALCIUM 8.9 06/02/2022 0657   GFRNONAA >60 10/26/2023 1415   Lab Results  Component Value Date   WBC 4.3 06/02/2022   HGB 12.5 06/02/2022   HCT 38.6 06/02/2022   MCV 92.1 06/02/2022   PLT 177 06/02/2022    Imaging: [facility] Clinician Interpretation: I have personally reviewed the CNS images as listed.  My interpretation, in the context of the patient's clinical presentation, is likely treatment effect  MR Brain W Wo Contrast Addendum Date: 04/25/2024 ADDENDUM REPORT: 04/25/2024 08:07 ADDENDUM: Study reviewed at Multidisciplinary Brain Tumor Conference this morning, with additional clinical history of interval radiation treatment to the residual  enhancing left posterior temporal/inferior operculum lesion. Therefore, the progressive T2 and FLAIR hyperintensity in the left temporal lobe might be treatment effect rather than true progression. After discussion, clinical team is planning for steroid treatment and a short interval repeat MRI to re-evaluate. Electronically Signed   By: VEAR Hurst M.D.   On: 04/25/2024 08:07   Result Date: 04/25/2024 CLINICAL DATA:  EXAM: MRI BRAIN WITH AND WITHOUT CONTRAST 04/21/2024 12:59:55 PM TECHNIQUE: Multiplanar multisequence MRI of the head/brain was performed with and without the administration of intravenous contrast. COMPARISON: MRI of the head dated 10/21/2023. CLINICAL HISTORY: Meningioma; Follow-up of treated Meningioma. Prior craniotomy 2023; F/U meninogioma; 7.5ml vial, 7ml Vueway  given .5ml wasted. FINDINGS: BRAIN AND VENTRICLES: No acute infarct. No  acute intracranial hemorrhage. No midline shift. No hydrocephalus. The sella is unremarkable. Normal flow voids. There has been significant interval enlargement of an extraaxial mass overlying the left temporal lobe. The mass is difficult to accurately measure but appears significantly larger on the axial and coronal contrast enhanced T1-weighted sequences. On the axial images, the mass has increased from approximately 2.1 x 0.8 cm in AP and transverse dimension to approximately 3.3 x 1.9 cm. On the coronal images, the bulky portion of the mass has increased in size from 1.4 cm in transverse diameter and 2.5 cm in craniocaudad length to approximately 2.0 cm and 3.1 cm. There is also a curvilinear branching area of dural thickening extending superiorly and posteriorly, which appears similar to the prior exam. There has been interval worsening of mass effect upon the left temporal lobe. There has also been significant interval worsening of vasogenic edema within the left temporal lobe white matter. ORBITS: No acute abnormality. SINUSES: No acute abnormality. BONES AND SOFT TISSUES: Normal bone marrow signal and enhancement. No acute soft tissue abnormality. IMPRESSION: 1. Significant interval enlargement of the extra-axial mass overlying the left temporal lobe. 2. Interval worsening of mass effect on the left temporal lobe. 3. Significant interval increase in vasogenic edema within the left temporal lobe white matter. Electronically signed by: Evalene Coho MD 04/22/2024 12:50 PM EDT RP Workstation: [id number] Electronically Signed   By: VEAR Hurst M.D.   On: 04/25/2024 08:07     Assessment/Plan Meningioma, recurrent of brain [facility])  We appreciate the opportunity to participate in the care of [facility].  She is clinically stable and non-focal today.  MRI brain demonstrates inflammation within site of radiosurgery, consistent with likely radio-inflammatory process.  Rapid growth of meningioma is  considered much less likely.  Discussed and recommended holding off on steroids unless neurologic or systemic symptoms develop.  She is agreeable with this.  Otherwise will recommend close imaging surveillance of these changes, with repeat MRI brain in 3 months.  Case was reviewed additionally in CNS tumor board this week.  Screening for potential clinical trials was performed and discussed using eligibility criteria for active protocols at [facility], loco-regional tertiary centers, as well as national database available on GroundTransfer.at.    We spent twenty additional minutes teaching regarding the natural history, biology, and historical experience in the treatment of brain tumors. We then discussed in detail the current recommendations for therapy focusing on the mode of administration, mechanism of action, anticipated toxicities, and quality of life issues associated with this plan. We also provided teaching sheets for the patient to take home as an additional resource.  We ask that Amrita Radu return to clinic in 3 months following next brain MRI, or sooner as needed.  All questions  were answered. The patient knows to call the clinic with any problems, questions or concerns. No barriers to learning were detected.  The total time spent in the encounter was 60 minutes and more than 50% was on counseling and review of test results   Arthea MARLA Manns, MD Medical Director of Neuro-Oncology [facility] at [facility] 04/26/24 11:11 AM

## 2024-04-26 NOTE — Telephone Encounter (Signed)
 Scheduled patient for next appointment. Called and spoke with the patient, she is aware.

## 2024-04-27 ENCOUNTER — Other Ambulatory Visit: Payer: Self-pay | Admitting: Radiation Therapy

## 2024-06-01 DIAGNOSIS — H52203 Unspecified astigmatism, bilateral: Secondary | ICD-10-CM | POA: Diagnosis not present

## 2024-06-01 DIAGNOSIS — H5211 Myopia, right eye: Secondary | ICD-10-CM | POA: Diagnosis not present

## 2024-06-01 DIAGNOSIS — H353131 Nonexudative age-related macular degeneration, bilateral, early dry stage: Secondary | ICD-10-CM | POA: Diagnosis not present

## 2024-08-04 ENCOUNTER — Ambulatory Visit
Admission: RE | Admit: 2024-08-04 | Discharge: 2024-08-04 | Disposition: A | Discharge status: Home / Self Care | Source: Ambulatory Visit | Attending: Internal Medicine | Admitting: Internal Medicine

## 2024-08-04 DIAGNOSIS — D320 Benign neoplasm of cerebral meninges: Secondary | ICD-10-CM

## 2024-08-04 MED ORDER — GADOPICLENOL 0.5 MMOL/ML IV SOLN
7.0000 mL | Freq: Once | INTRAVENOUS | Status: AC | PRN
  Administered 2024-08-04: 7 mL via INTRAVENOUS

## 2024-08-08 ENCOUNTER — Inpatient Hospital Stay: Attending: Internal Medicine | Admitting: Internal Medicine

## 2024-08-08 ENCOUNTER — Encounter

## 2024-08-08 VITALS — BP 151/70 | HR 58 | Temp 97.2°F | Resp 16 | Ht 63.0 in | Wt 151.0 lb

## 2024-08-08 DIAGNOSIS — Z885 Allergy status to narcotic agent status: Secondary | ICD-10-CM | POA: Insufficient documentation

## 2024-08-08 DIAGNOSIS — Z886 Allergy status to analgesic agent status: Secondary | ICD-10-CM | POA: Insufficient documentation

## 2024-08-08 DIAGNOSIS — Z880 Allergy status to penicillin: Secondary | ICD-10-CM | POA: Diagnosis not present

## 2024-08-08 DIAGNOSIS — Z9089 Acquired absence of other organs: Secondary | ICD-10-CM | POA: Insufficient documentation

## 2024-08-08 DIAGNOSIS — Z801 Family history of malignant neoplasm of trachea, bronchus and lung: Secondary | ICD-10-CM | POA: Insufficient documentation

## 2024-08-08 DIAGNOSIS — D320 Benign neoplasm of cerebral meninges: Secondary | ICD-10-CM | POA: Insufficient documentation

## 2024-08-08 DIAGNOSIS — Z8711 Personal history of peptic ulcer disease: Secondary | ICD-10-CM | POA: Insufficient documentation

## 2024-08-08 DIAGNOSIS — Z820 Family history of epilepsy and other diseases of the nervous system: Secondary | ICD-10-CM | POA: Diagnosis not present

## 2024-08-08 DIAGNOSIS — Z79899 Other long term (current) drug therapy: Secondary | ICD-10-CM | POA: Insufficient documentation

## 2024-08-08 DIAGNOSIS — Z923 Personal history of irradiation: Secondary | ICD-10-CM | POA: Insufficient documentation

## 2024-08-08 DIAGNOSIS — Z881 Allergy status to other antibiotic agents status: Secondary | ICD-10-CM | POA: Insufficient documentation

## 2024-08-08 NOTE — Progress Notes (Signed)
 "  [facility] at [facility] [address]  [place], [place] [address] [phone]   Interval Evaluation  Date of Service: 08/08/2024 Patient Name: Annette Wilson Patient MRN: [id number] Patient DOB: 01/13/1946 Provider: Arthea MARLA Manns, MD  Identifying Statement:  Annette Wilson is a 79 y.o. 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 Cheryle; path demonstrates WHO 1 meningioma 11/13/23: Modest growth, completes 25/5 SRS with Dr. Izell   Biomarkers: n/a  Interval History: Annette Wilson presents today for follow up after MRI brain study.  Denies any new or progressive neurologic symptoms.  Denies seizures, headaches, memory impairment.  Did have one episode of speech arrest, self limited, following appt last October.  No recurrence since that time.  Otherwise maintains functional independence.  H+P (04/26/24) Patient presents today for follow up after recent MRI brain.  She completed radiation in April for recurrent meningioma.  No neurologic complaints today.  Denies issues with memory, cognition, speech, or overall energy.  Prior to surgery in 2023 she did experience some episodes of speech arrest, but none since that time.  Fully independent with activities of daily living otherwise.  Medications: Current Outpatient Medications on File Prior to Visit  Medication Sig Dispense Refill   EPINEPHrine  0.3 mg/0.3 mL IJ SOAJ injection Inject 0.3 mg into the muscle as needed for anaphylaxis.     estradiol (ESTRACE) 0.1 MG/GM vaginal cream Place 1 Applicatorful vaginally 2 (two) times a week.     Vitamin D , Ergocalciferol , (DRISDOL) 1.25 MG (50000 UNIT) CAPS capsule Take 50,000 Units by mouth once a week.     No current facility-administered medications on file prior to visit.    Allergies:  Allergies  Allergen Reactions   Alpha-Gal Anaphylaxis and Hives   Aspirin  Anaphylaxis    REACTION: throat swelling   Cefadroxil Anaphylaxis   Cephalosporins    Codeine    Penicillins     Unknown reaction, childhood allergy   Past Medical History:  Past Medical History:  Diagnosis Date   Abscess 1954   developed abscesses after her tonsilectomy   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   Duodenal ulcer 1961   Hepatitis 1951   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   Past Surgical History:  Past Surgical History:  Procedure Laterality Date   APPLICATION OF CRANIAL NAVIGATION N/A 06/02/2022   Procedure: APPLICATION OF CRANIAL NAVIGATION;  Surgeon: Cheryle Debby LABOR, MD;  Location: [facility] OR;  Service: Neurosurgery;  Laterality: N/A;   CRANIOTOMY Left 06/02/2022   Procedure: Left craniotomy for tumor resection;  Surgeon: Cheryle Debby LABOR, MD;  Location: [facility] OR;  Service: Neurosurgery;  Laterality: Left;  RM 20   DIAGNOSTIC LAPAROSCOPY  1978   looking for fallopian tube blockage   HAND SURGERY Left    tumor removed between 1 & 2 nd digits   TONSILLECTOMY  1954   Social History:  Social History   Socioeconomic History   Marital status: Married    Spouse name: Prentice   Number of children: 2   Years of education: Not on file   Highest education level: Doctorate  Occupational History   Occupation: interior and spatial designer  Tobacco Use   Smoking status: Never   Smokeless tobacco: Never  Vaping Use   Vaping status: Never Used  Substance and Sexual Activity   Alcohol use: Not Currently   Drug use: Never   Sexual activity: Yes  Other Topics Concern   Not  on file  Social History Narrative   Patient is left-handed. She lives with her husbanded in a 2 level home, master on the first floor. She drinks 3 cups of black tea a day. She has been unable to exercise to bi-lateral knee injuries.   Social Drivers of Health   Tobacco Use: Low Risk (02/22/2024)   Patient History    Smoking Tobacco Use: Never    Smokeless Tobacco Use: Never    Passive Exposure: Not on file  Financial Resource  Strain: Not on file  Food Insecurity: No Food Insecurity (04/26/2024)   Epic    Worried About Programme Researcher, Broadcasting/film/video in the Last Year: Never true    Ran Out of Food in the Last Year: Never true  Transportation Needs: No Transportation Needs (04/26/2024)   Epic    Lack of Transportation (Medical): No    Lack of Transportation (Non-Medical): No  Physical Activity: Not on file  Stress: Not on file  Social Connections: Not on file  Intimate Partner Violence: Not At Risk (04/26/2024)   Epic    Fear of Current or Ex-Partner: No    Emotionally Abused: No    Physically Abused: No    Sexually Abused: No  Depression (PHQ2-9): Low Risk (04/26/2024)   Depression (PHQ2-9)    PHQ-2 Score: 0  Alcohol Screen: Not on file  Housing: Low Risk (04/26/2024)   Epic    Unable to Pay for Housing in the Last Year: No    Number of Times Moved in the Last Year: 0    Homeless in the Last Year: No  Utilities: Not At Risk (04/26/2024)   Epic    Threatened with loss of utilities: No  Health Literacy: Not on file   Family History:  Family History  Problem Relation Age of Onset   Alzheimer's disease Mother 63   Lung cancer Father 4    Review of Systems: Constitutional: Doesn't report fevers, chills or abnormal weight loss Eyes: Doesn't report blurriness of vision Ears, nose, mouth, throat, and face: Doesn't report sore throat Respiratory: Doesn't report cough, dyspnea or wheezes Cardiovascular: Doesn't report palpitation, chest discomfort  Gastrointestinal:  Doesn't report nausea, constipation, diarrhea GU: Doesn't report incontinence Skin: Doesn't report skin rashes Neurological: Per HPI Musculoskeletal: Doesn't report joint pain Behavioral/Psych: Doesn't report anxiety  Physical Exam: There were no vitals filed for this visit.  KPS: 90. General: Alert, cooperative, pleasant, in no acute distress Head: Normal EENT: No conjunctival injection or scleral icterus.  Lungs: Resp effort normal Cardiac:  Regular rate Abdomen: Non-distended abdomen Skin: No rashes cyanosis or petechiae. Extremities: No clubbing or edema  Neurologic Exam: Mental Status: Awake, alert, attentive to examiner. Oriented to self and environment. Language is fluent with intact comprehension.  Cranial Nerves: Visual acuity is grossly normal. Visual fields are full. Extra-ocular movements intact. No ptosis. Face is symmetric Motor: Tone and bulk are normal. Power is full in both arms and legs. Reflexes are symmetric, no pathologic reflexes present.  Sensory: Intact to light touch Gait: Normal.   Labs: I have reviewed the data as listed    Component Value Date/Time   NA 139 06/02/2022 0829   K 3.6 06/02/2022 0829   CL 105 06/02/2022 0657   CO2 25 06/02/2022 0657   GLUCOSE 105 (H) 06/02/2022 0657   BUN 17 10/26/2023 1415   CREATININE 0.78 10/26/2023 1415   CALCIUM 8.9 06/02/2022 0657   GFRNONAA >60 10/26/2023 1415   Lab Results  Component Value Date  WBC 4.3 06/02/2022   HGB 12.5 06/02/2022   HCT 38.6 06/02/2022   MCV 92.1 06/02/2022   PLT 177 06/02/2022    Imaging: [facility] Clinician Interpretation: I have personally reviewed the CNS images as listed.  My interpretation, in the context of the patient's clinical presentation, is stable disease  MR BRAIN W WO CONTRAST Result Date: 08/04/2024 EXAM: MRI BRAIN WITH AND WITHOUT CONTRAST 08/04/2024 11:24:00 AM TECHNIQUE: Multiplanar multisequence MRI of the head/brain was performed with and without the administration of 7 ml VUEWAY  intravenous contrast. COMPARISON: MRI 03/2024 and MRI 10/21/2023. CLINICAL HISTORY: 79 year old female. History of treated left temporal meningioma, including radiation between March and September 2025. Restaging. FINDINGS: BRAIN AND VENTRICLES: No acute infarct. No acute intracranial hemorrhage. No hydrocephalus. The sella is unremarkable. Normal flow voids. The major dural venous sinuses are enhancing and appear patent. Left  frontotemporal craniotomy again noted. Subjacent T2 and FLAIR hyperintensity in the left temporal lobe has regressed since September (series 10 image 25 versus series 16 image 25 previously). No significant regional mass effect. Overlying heterogeneously enhancing soft tissue, which is both nodular and appears partially leptomeningeal (series 15 image 31). The most confluent component of this on coronal image 32 now encompasses 26 x 15 mm (versus 31 x 20 mm previously), and now does not appear significantly changed from the MRI October 21, 2023 (series 15 image 31 now versus series 15 image 30 at that time). Other smooth pachymeningeal enhancement underlying the craniotomy appears stable and postoperative. No new areas of abnormal intracranial enhancement. Stable brain volume. No new signal abnormality. ORBITS: No acute abnormality. SINUSES: No acute abnormality. BONES AND SOFT TISSUES: Negative visible cervical spine and spinal cord. Normal bone marrow signal and enhancement. No acute soft tissue abnormality. IMPRESSION: 1. Regression of residual enhancing soft tissue and adjacent left temporal lobe T2/FLAIR signal since September, now closely resembling the appearance from March 2025. 2. No new intracranial abnormality. Electronically signed by: Helayne Hurst MD MD 08/04/2024 11:55 AM EST RP Workstation: [id number]     Assessment/Plan Meningioma, recurrent of brain [facility])  We appreciate the opportunity to participate in the care of [facility].  She is clinically stable and non-focal today.  MRI brain demonstrates improvement  of inflammation within site of radiosurgery, consistent with likely radio-inflammatory process.    Will remain off steroids, recommend continuing imaging surveillance only.  We ask that Kenia Teagarden return to clinic in 6 months following next brain MRI, or sooner as needed.  All questions were answered. The patient knows to call the clinic with any problems, questions  or concerns. No barriers to learning were detected.  The total time spent in the encounter was 40 minutes and more than 50% was on counseling and review of test results   Arthea MARLA Manns, MD Medical Director of Neuro-Oncology [facility] 08/08/2024 11:13 AM "

## 2025-02-01 ENCOUNTER — Other Ambulatory Visit

## 2025-02-06 ENCOUNTER — Inpatient Hospital Stay

## 2025-02-07 ENCOUNTER — Inpatient Hospital Stay: Admitting: Internal Medicine
